# Patient Record
Sex: Female | Born: 1955 | Race: White | Hispanic: No | Smoking: Never smoker
Health system: Southern US, Community
[De-identification: ages and names within clinical notes are randomized; demographics above are authoritative.]

## PROBLEM LIST (undated history)

## (undated) DIAGNOSIS — J45909 Unspecified asthma, uncomplicated: Secondary | ICD-10-CM

## (undated) DIAGNOSIS — F32A Depression, unspecified: Secondary | ICD-10-CM

## (undated) DIAGNOSIS — M797 Fibromyalgia: Secondary | ICD-10-CM

## (undated) DIAGNOSIS — G4730 Sleep apnea, unspecified: Secondary | ICD-10-CM

## (undated) DIAGNOSIS — F419 Anxiety disorder, unspecified: Secondary | ICD-10-CM

## (undated) DIAGNOSIS — R87629 Unspecified abnormal cytological findings in specimens from vagina: Secondary | ICD-10-CM

## (undated) DIAGNOSIS — O139 Gestational [pregnancy-induced] hypertension without significant proteinuria, unspecified trimester: Secondary | ICD-10-CM

## (undated) DIAGNOSIS — M1990 Unspecified osteoarthritis, unspecified site: Secondary | ICD-10-CM

## (undated) DIAGNOSIS — O24419 Gestational diabetes mellitus in pregnancy, unspecified control: Secondary | ICD-10-CM

## (undated) DIAGNOSIS — I10 Essential (primary) hypertension: Secondary | ICD-10-CM

## (undated) HISTORY — DX: Unspecified abnormal cytological findings in specimens from vagina: R87.629

## (undated) HISTORY — DX: Depression, unspecified: F32.A

## (undated) HISTORY — DX: Sleep apnea, unspecified: G47.30

## (undated) HISTORY — DX: Unspecified osteoarthritis, unspecified site: M19.90

## (undated) HISTORY — PX: BREAST SURGERY: SHX581

## (undated) HISTORY — DX: Gestational (pregnancy-induced) hypertension without significant proteinuria, unspecified trimester: O13.9

## (undated) HISTORY — PX: TUBAL LIGATION: SHX77

## (undated) HISTORY — DX: Unspecified asthma, uncomplicated: J45.909

## (undated) HISTORY — DX: Anxiety disorder, unspecified: F41.9

## (undated) HISTORY — DX: Gestational diabetes mellitus in pregnancy, unspecified control: O24.419

---

## 2016-08-30 LAB — HM HEPATITIS C SCREENING LAB: HM Hepatitis Screen: NEGATIVE

## 2019-10-12 ENCOUNTER — Emergency Department (HOSPITAL_COMMUNITY): Payer: No Typology Code available for payment source

## 2019-10-12 ENCOUNTER — Emergency Department (HOSPITAL_COMMUNITY)
Admission: EM | Admit: 2019-10-12 | Discharge: 2019-10-12 | Disposition: A | Discharge status: Home / Self Care | Payer: No Typology Code available for payment source | Attending: Emergency Medicine | Admitting: Emergency Medicine

## 2019-10-12 ENCOUNTER — Other Ambulatory Visit: Payer: Self-pay

## 2019-10-12 ENCOUNTER — Encounter (HOSPITAL_COMMUNITY): Payer: Self-pay

## 2019-10-12 DIAGNOSIS — I10 Essential (primary) hypertension: Secondary | ICD-10-CM | POA: Diagnosis not present

## 2019-10-12 DIAGNOSIS — R0789 Other chest pain: Secondary | ICD-10-CM | POA: Insufficient documentation

## 2019-10-12 DIAGNOSIS — R0602 Shortness of breath: Secondary | ICD-10-CM | POA: Insufficient documentation

## 2019-10-12 DIAGNOSIS — R072 Precordial pain: Secondary | ICD-10-CM

## 2019-10-12 DIAGNOSIS — R079 Chest pain, unspecified: Secondary | ICD-10-CM

## 2019-10-12 HISTORY — DX: Fibromyalgia: M79.7

## 2019-10-12 HISTORY — DX: Essential (primary) hypertension: I10

## 2019-10-12 LAB — TROPONIN I (HIGH SENSITIVITY)
Troponin I (High Sensitivity): <2 ng/L (ref <18)
Troponin I (High Sensitivity): <2 ng/L (ref <18)

## 2019-10-12 LAB — CBC
HCT: 44.2 % (ref 36.0–46.0)
Hemoglobin: 14.3 g/dL (ref 12.0–15.0)
MCH: 29.4 pg (ref 26.0–34.0)
MCHC: 32.4 g/dL (ref 30.0–36.0)
MCV: 90.8 fL (ref 80.0–100.0)
Platelets: 438 10*3/uL — ABNORMAL HIGH (ref 150–400)
RBC: 4.87 MIL/uL (ref 3.87–5.11)
RDW: 13.1 % (ref 11.5–15.5)
WBC: 7.4 10*3/uL (ref 4.0–10.5)
nRBC: 0 % (ref 0.0–0.2)

## 2019-10-12 LAB — BASIC METABOLIC PANEL
Anion gap: 10 (ref 5–15)
BUN: 14 mg/dL (ref 8–23)
CO2: 25 mmol/L (ref 22–32)
Calcium: 9.9 mg/dL (ref 8.9–10.3)
Chloride: 104 mmol/L (ref 98–111)
Creatinine, Ser: 0.79 mg/dL (ref 0.44–1.00)
GFR calc Af Amer: >60 mL/min (ref >60)
GFR calc non Af Amer: >60 mL/min (ref >60)
Glucose, Bld: 93 mg/dL (ref 70–99)
Potassium: 4.3 mmol/L (ref 3.5–5.1)
Sodium: 139 mmol/L (ref 135–145)

## 2019-10-12 LAB — D-DIMER, QUANTITATIVE: D-Dimer, Quant: 0.73 ug/mL-FEU — ABNORMAL HIGH (ref 0.00–0.50)

## 2019-10-12 LAB — BRAIN NATRIURETIC PEPTIDE: B Natriuretic Peptide: 40.4 pg/mL (ref 0.0–100.0)

## 2019-10-12 MED ORDER — SODIUM CHLORIDE 0.9% FLUSH
3.0000 mL | Freq: Once | INTRAVENOUS | Status: AC
  Administered 2019-10-12: 3 mL via INTRAVENOUS

## 2019-10-12 MED ORDER — NITROGLYCERIN 0.4 MG SL SUBL: 0.4000 mg | SUBLINGUAL_TABLET | SUBLINGUAL | Status: DC | PRN

## 2019-10-12 MED ORDER — ASPIRIN 81 MG PO CHEW
324.0000 mg | CHEWABLE_TABLET | Freq: Once | ORAL | Status: AC
  Administered 2019-10-12: 324 mg via ORAL
  Filled 2019-10-12: qty 4

## 2019-10-12 MED ORDER — IOHEXOL 350 MG/ML SOLN
75.0000 mL | Freq: Once | INTRAVENOUS | Status: AC | PRN
  Administered 2019-10-12: 75 mL via INTRAVENOUS

## 2019-10-12 NOTE — Discharge Instructions (Addendum)
We saw you in the ER for the chest pain/shortness of breath. All of our cardiac workup is normal, including labs, EKG and chest X-RAY are normal.  We are not sure what is causing your discomfort, but we feel comfortable sending you home at this time.   We recommend that you follow-up with the cardiology clinic in 3 to 5 days. Please return to the ER if you have worsening chest pain, shortness of breath, pain radiating to your jaw, shoulder, or back, sweats or fainting. Otherwise see the Cardiologist or your primary care doctor as requested.

## 2019-10-12 NOTE — ED Provider Notes (Signed)
[facility] Provider Note   CSN: [id number] Arrival date & time: 10/12/19  1406     History Chief Complaint  Patient presents with  . Chest Pain    Christina Harrington is a 63 y.o. female.  HPI  HPI: A 63 year old patient with a history of hypertension presents for evaluation of chest pain. Initial onset of pain was more than 6 hours ago. The patient's chest pain is described as heaviness/pressure/tightness and is worse with exertion. The patient complains of nausea and reports some diaphoresis. The patient's chest pain is middle- or left-sided, is not well-localized, is not sharp and does not radiate to the arms/jaw/neck. The patient has no history of stroke, has no history of peripheral artery disease, has not smoked in the past 90 days, denies any history of treated diabetes, has no relevant family history of coronary artery disease (first degree relative at less than age 64), has no history of hypercholesterolemia and does not have an elevated BMI (>=30).   63 year old female with history of hypertension comes in a chief complaint of chest pain. She has had constant chest pain for the last day and a half.  There is waxing and waning intensity, and the chest pain is typically worse with exertion.  Pain is described as heaviness.  There is associated shortness of breath.  Patient has history of DVT and PE.  She is on Eliquis.  She reports that the quality of pain is similar to her PE pain as well, however with her PE that she had pleuritic component to the chest pain as well which is not present at this time.  Review of systems also negative for any cough.   Past Medical History:  Diagnosis Date  . Fibromyalgia syndrome   . Hypertension     There are no problems to display for this patient.   History reviewed. No pertinent surgical history.   OB History   No obstetric history on file.     No family history on file.  Social History    Tobacco Use  . Smoking status: Not on file  Substance Use Topics  . Alcohol use: Not on file  . Drug use: Not on file    Home Medications Prior to Admission medications   Not on File    Allergies    Patient has no known allergies.  Review of Systems   Review of Systems  Constitutional: Positive for activity change.  Respiratory: Positive for shortness of breath.   Cardiovascular: Positive for chest pain.  Gastrointestinal: Negative for nausea and vomiting.  Hematological: Does not bruise/bleed easily.  All other systems reviewed and are negative.   Physical Exam Updated Vital Signs BP 137/78   Pulse 79   Temp 98.4 F (36.9 C) (Oral)   Resp 17   SpO2 99%   Physical Exam Vitals and nursing note reviewed.  Constitutional:      Appearance: She is well-developed.  HENT:     Head: Normocephalic and atraumatic.  Cardiovascular:     Rate and Rhythm: Normal rate.  Pulmonary:     Effort: Pulmonary effort is normal.  Abdominal:     General: Bowel sounds are normal.  Musculoskeletal:     Cervical back: Normal range of motion and neck supple.     Right lower leg: No tenderness. No edema.     Left lower leg: No tenderness. No edema.  Skin:    General: Skin is warm and dry.  Neurological:  Mental Status: She is alert and oriented to person, place, and time.     ED Results / Procedures / Treatments   Labs (all labs ordered are listed, but only abnormal results are displayed) Labs Reviewed  CBC - Abnormal; Notable for the following components:      Result Value   Platelets 438 (*)    All other components within normal limits  D-DIMER, QUANTITATIVE (NOT AT [facility]) - Abnormal; Notable for the following components:   D-Dimer, Quant 0.73 (*)    All other components within normal limits  BASIC METABOLIC PANEL  BRAIN NATRIURETIC PEPTIDE  TROPONIN I (HIGH SENSITIVITY)  TROPONIN I (HIGH SENSITIVITY)    EKG EKG Interpretation  Date/Time:  Monday October 12 2019 14:17:02 EST Ventricular Rate:  106 PR Interval:  138 QRS Duration: 70 QT Interval:  338 QTC Calculation: 448 R Axis:   49 Text Interpretation: Sinus tachycardia Otherwise normal ECG No old tracing to compare No acute changes Confirmed by Derwood Kaplan [phone]) on 10/12/2019 5:28:57 PM   Radiology DG Chest 2 View  Result Date: 10/12/2019 CLINICAL DATA:  Chest pain, shortness of breath EXAM: CHEST - 2 VIEW COMPARISON:  None. FINDINGS: Heart and mediastinal contours are within normal limits. No focal opacities or effusions. No acute bony abnormality. IMPRESSION: No active cardiopulmonary disease. Electronically Signed   By: Charlett Nose M.D.   On: 10/12/2019 14:46   CT Angio Chest PE W and/or Wo Contrast  Result Date: 10/12/2019 CLINICAL DATA:  Chest pain EXAM: CT ANGIOGRAPHY CHEST WITH CONTRAST TECHNIQUE: Multidetector CT imaging of the chest was performed using the standard protocol during bolus administration of intravenous contrast. Multiplanar CT image reconstructions and MIPs were obtained to evaluate the vascular anatomy. CONTRAST:  41mL OMNIPAQUE IOHEXOL 350 MG/ML SOLN COMPARISON:  None. FINDINGS: Cardiovascular: No filling defects in the pulmonary arteries to suggest pulmonary emboli. Heart is normal size. Aorta is normal caliber. Mediastinum/Nodes: No mediastinal, hilar, or axillary adenopathy. Trachea and esophagus are unremarkable. Thyroid unremarkable. Lungs/Pleura: Small calcified granuloma in the left upper lobe. No confluent opacities or effusions. Upper Abdomen: Calcified granuloma is in the liver. No acute findings. Musculoskeletal: Chest wall soft tissues are unremarkable. No acute bony abnormality. Review of the MIP images confirms the above findings. IMPRESSION: No evidence of pulmonary embolus. No acute cardiopulmonary disease. Electronically Signed   By: Charlett Nose M.D.   On: 10/12/2019 19:03    Procedures Procedures (including critical care time)  Medications  Ordered in ED Medications  nitroGLYCERIN (NITROSTAT) SL tablet 0.4 mg (has no administration in time range)  sodium chloride flush (NS) 0.9 % injection 3 mL (3 mLs Intravenous Given 10/12/19 1746)  aspirin chewable tablet 324 mg (324 mg Oral Given 10/12/19 1740)  iohexol (OMNIPAQUE) 350 MG/ML injection 75 mL (75 mLs Intravenous Contrast Given 10/12/19 1853)    ED Course  I have reviewed the triage vital signs and the nursing notes.  Pertinent labs & imaging results that were available during my care of the patient were reviewed by me and considered in my medical decision making (see chart for details).    MDM Rules/Calculators/A&P HEAR Score: 4                     Final Clinical Impression(s) / ED Diagnoses Final diagnoses:  Nonspecific chest pain  Precordial chest pain    63 year old comes in a chief complaint of chest pain. Patient has history of hypertension.  Hear score is 4.  Pain  is constant and has some typical features as it is described as heaviness and it is worse with exertion.  She has history of PE, DVT and is on Eliquis.  D-dimer ordered and it is positive.  CT PE is negative for any acute findings.  Delta troponin in the ED are negative. For now we feel comfortable sending patient home with cardiology follow-up.  Strict ER return precautions have been discussed with the patient and she is comfortable returning to the ER if her symptoms get worse.  Rx / DC Orders ED Discharge Orders    None       Varney Biles, MD 10/12/19 [phone]

## 2019-10-12 NOTE — ED Triage Notes (Signed)
Patient complains of SSCP that started last night and describes as a pressure. No associated symptoms. Reports sent by [facility]. Denies cough, no fever

## 2019-10-12 NOTE — ED Notes (Signed)
Pt reports chest "pressure."

## 2020-02-25 ENCOUNTER — Other Ambulatory Visit: Payer: Self-pay

## 2020-02-25 ENCOUNTER — Encounter: Payer: Self-pay | Admitting: Podiatry

## 2020-02-25 ENCOUNTER — Ambulatory Visit (INDEPENDENT_AMBULATORY_CARE_PROVIDER_SITE_OTHER): Payer: No Typology Code available for payment source | Admitting: Podiatry

## 2020-02-25 ENCOUNTER — Ambulatory Visit (INDEPENDENT_AMBULATORY_CARE_PROVIDER_SITE_OTHER): Payer: No Typology Code available for payment source

## 2020-02-25 ENCOUNTER — Other Ambulatory Visit: Payer: Self-pay | Admitting: Podiatry

## 2020-02-25 VITALS — BP 108/80 | HR 95 | Temp 96.5°F | Resp 16

## 2020-02-25 DIAGNOSIS — M205X1 Other deformities of toe(s) (acquired), right foot: Secondary | ICD-10-CM

## 2020-02-25 DIAGNOSIS — M2012 Hallux valgus (acquired), left foot: Secondary | ICD-10-CM

## 2020-02-25 DIAGNOSIS — M25571 Pain in right ankle and joints of right foot: Secondary | ICD-10-CM

## 2020-02-25 DIAGNOSIS — M779 Enthesopathy, unspecified: Secondary | ICD-10-CM

## 2020-02-25 DIAGNOSIS — M2011 Hallux valgus (acquired), right foot: Secondary | ICD-10-CM

## 2020-02-25 DIAGNOSIS — M778 Other enthesopathies, not elsewhere classified: Secondary | ICD-10-CM

## 2020-02-25 NOTE — Progress Notes (Signed)
   Subjective:    Patient ID: Christina Harrington, female    DOB: 05-Sep-1956, 64 y.o.   MRN: [id number]  HPI    Review of Systems  All other systems reviewed and are negative.      Objective:   Physical Exam        Assessment & Plan:

## 2020-02-25 NOTE — Progress Notes (Signed)
Subjective:   Patient ID: Christina Harrington, female   DOB: 64 y.o.   MRN: [id number]   HPI Patient presents stating she has had history of bunion surgery row at a [place] in [place] and has a lot of pain in the joint and knows eventually she will need surgery again but she is trying to avoid it and orthotics have been helpful in the past and she needs a new pair.  States is been very inflamed and sore and its been going on for a fairly long period of time.  Patient does not smoke would like to be more active   Review of Systems  All other systems reviewed and are negative.       Objective:  Physical Exam Vitals and nursing note reviewed.  Constitutional:      Appearance: She is well-developed.  Pulmonary:     Effort: Pulmonary effort is normal.  Musculoskeletal:        General: Normal range of motion.  Skin:    General: Skin is warm.  Neurological:     Mental Status: She is alert.     Neurovascular status intact muscle strength adequate range of motion within normal limits.  Patient is found to have exquisite discomfort in the first MPJ right with complete lack of motion and history of surgery in the joint surface.  Patient has moderate discomfort in the left not to the same degree and has had hammertoe repair second right.  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  Patient understands ultimately will require implant or fusion and I educated on the differences between these 2 and most likely she would like implant  X-rays indicate that the patient has complete closure of the first MPJ right with flattening of the joint surface with moderate deformity on the left

## 2020-02-25 NOTE — Patient Instructions (Signed)
Bunion  A bunion is a bump on the base of the big toe that forms when the bones of the big toe joint move out of position. Bunions may be small at first, but they often get larger over time. They can make walking painful. What are the causes? A bunion may be caused by:  Wearing narrow or pointed shoes that force the big toe to press against the other toes.  Abnormal foot development that causes the foot to roll inward (pronate).  Changes in the foot that are caused by certain diseases, such as rheumatoid arthritis or polio.  A foot injury. What increases the risk? The following factors may make you more likely to develop this condition:  Wearing shoes that squeeze the toes together.  Having certain diseases, such as: ? Rheumatoid arthritis. ? Polio. ? Cerebral palsy.  Having family members who have bunions.  Being born with a foot deformity, such as flat feet or low arches.  Doing activities that put a lot of pressure on the feet, such as ballet dancing. What are the signs or symptoms? The main symptom of a bunion is a noticeable bump on the big toe. Other symptoms may include:  Pain.  Swelling around the big toe.  Redness and inflammation.  Thick or hardened skin on the big toe or between the toes.  Stiffness or loss of motion in the big toe.  Trouble with walking. How is this diagnosed? A bunion may be diagnosed based on your symptoms, medical history, and activities. You may have tests, such as:  X-rays. These allow your health care provider to check the position of the bones in your foot and look for damage to your joint. They also help your health care provider determine the severity of your bunion and the best way to treat it.  Joint aspiration. In this test, a sample of fluid is removed from the toe joint. This test may be done if you are in a lot of pain. It helps rule out diseases that cause painful swelling of the joints, such as arthritis. How is this  treated? Treatment depends on the severity of your symptoms. The goal of treatment is to relieve symptoms and prevent the bunion from getting worse. Your health care provider may recommend:  Wearing shoes that have a wide toe box.  Using bunion pads to cushion the affected area.  Taping your toes together to keep them in a normal position.  Placing a device inside your shoe (orthotics) to help reduce pressure on your toe joint.  Taking medicine to ease pain, inflammation, and swelling.  Applying heat or ice to the affected area.  Doing stretching exercises.  Surgery to remove scar tissue and move the toes back into their normal position. This treatment is rare. Follow these instructions at home: Managing pain, stiffness, and swelling   If directed, put ice on the painful area: ? Put ice in a plastic bag. ? Place a towel between your skin and the bag. ? Leave the ice on for 20 minutes, 2-3 times a day. Activity   If directed, apply heat to the affected area before you exercise. Use the heat source that your health care provider recommends, such as a moist heat pack or a heating pad. ? Place a towel between your skin and the heat source. ? Leave the heat on for 20-30 minutes. ? Remove the heat if your skin turns bright red. This is especially important if you are unable to feel pain,   heat, or cold. You may have a greater risk of getting burned.  Do exercises as told by your health care provider. General instructions  Support your toe joint with proper footwear, shoe padding, or taping as told by your health care provider.  Take over-the-counter and prescription medicines only as told by your health care provider.  Keep all follow-up visits as told by your health care provider. This is important. Contact a health care provider if your symptoms:  Get worse.  Do not improve in 2 weeks. Get help right away if you have:  Severe pain and trouble with walking. Summary  A  bunion is a bump on the base of the big toe that forms when the bones of the big toe joint move out of position.  Bunions can make walking painful.  Treatment depends on the severity of your symptoms.  Support your toe joint with proper footwear, shoe padding, or taping as told by your health care provider. This information is not intended to replace advice given to you by your health care provider. Make sure you discuss any questions you have with your health care provider. Document Revised: 04/21/2018 Document Reviewed: 02/25/2018 Elsevier Patient Education  2020 Elsevier Inc.  

## 2020-03-04 ENCOUNTER — Telehealth: Payer: Self-pay | Admitting: Podiatry

## 2020-03-04 NOTE — Telephone Encounter (Signed)
Left message for [facility] to see how we can go about getting pt orthotics thru our office.

## 2020-03-10 ENCOUNTER — Telehealth: Payer: Self-pay | Admitting: Podiatry

## 2020-03-10 NOTE — Telephone Encounter (Signed)
Received a call back from [place] @ [facility] [place] and she told me to contact Jody the nurse navigator @ [facility] (her # is [phone]) and leave a message with pts last name and last 4 of ssn.   I have called Jody and left this information.

## 2020-03-15 NOTE — Addendum Note (Signed)
Addended by: Fritz Pickerel A on: 03/15/2020 10:27 AM   Modules accepted: Orders

## 2020-03-31 ENCOUNTER — Other Ambulatory Visit: Payer: No Typology Code available for payment source | Admitting: Orthotics

## 2020-04-04 ENCOUNTER — Other Ambulatory Visit: Payer: Self-pay

## 2020-04-04 ENCOUNTER — Ambulatory Visit: Payer: No Typology Code available for payment source | Admitting: Orthotics

## 2020-04-04 DIAGNOSIS — M2011 Hallux valgus (acquired), right foot: Secondary | ICD-10-CM

## 2020-04-04 DIAGNOSIS — M779 Enthesopathy, unspecified: Secondary | ICD-10-CM

## 2020-04-04 DIAGNOSIS — M2012 Hallux valgus (acquired), left foot: Secondary | ICD-10-CM

## 2020-04-04 NOTE — Progress Notes (Signed)
Patient is here today to be evaluated and cast for CMFO.  Patient has hx of functional hallux limitus (FHL), and needs a supportive orthoses that will plantarflex first ray in order to lower hinge pin of first MPJ and enhance windless effect.  Plan of deep heel cup, hug arch, and MORTONS extension (she has had good results in past)

## 2020-04-14 ENCOUNTER — Other Ambulatory Visit: Payer: Self-pay

## 2020-04-14 ENCOUNTER — Ambulatory Visit (INDEPENDENT_AMBULATORY_CARE_PROVIDER_SITE_OTHER): Payer: No Typology Code available for payment source | Admitting: Podiatry

## 2020-04-14 ENCOUNTER — Encounter: Payer: Self-pay | Admitting: Podiatry

## 2020-04-14 DIAGNOSIS — M205X1 Other deformities of toe(s) (acquired), right foot: Secondary | ICD-10-CM | POA: Diagnosis not present

## 2020-04-14 NOTE — Patient Instructions (Signed)
Pre-Operative Instructions  Congratulations, you have decided to take an important step towards improving your quality of life.  You can be assured that the doctors and staff at [facility] will be with you every step of the way.  Here are some important things you should know:  1. Plan to be at the surgery center/hospital at least 1 (one) hour prior to your scheduled time, unless otherwise directed by the surgical center/hospital staff.  You must have a responsible adult accompany you, remain during the surgery and drive you home.  Make sure you have directions to the surgical center/hospital to ensure you arrive on time. 2. If you are having surgery at [facility] or [facility], you will need a copy of your medical history and physical form from your family physician within one month prior to the date of surgery. We will give you a form for your primary physician to complete.  3. We make every effort to accommodate the date you request for surgery.  However, there are times where surgery dates or times have to be moved.  We will contact you as soon as possible if a change in schedule is required.   4. No aspirin/ibuprofen for one week before surgery.  If you are on aspirin, any non-steroidal anti-inflammatory medications (Mobic, Aleve, Ibuprofen) should not be taken seven (7) days prior to your surgery.  You make take Tylenol for pain prior to surgery.  5. Medications - If you are taking daily heart and blood pressure medications, seizure, reflux, allergy, asthma, anxiety, pain or diabetes medications, make sure you notify the surgery center/hospital before the day of surgery so they can tell you which medications you should take or avoid the day of surgery. 6. No food or drink after midnight the night before surgery unless directed otherwise by surgical center/hospital staff. 7. No alcoholic beverages 24-hours prior to surgery.  No smoking 24-hours prior or 24-hours after  surgery. 8. Wear loose pants or shorts. They should be loose enough to fit over bandages, boots, and casts. 9. Don't wear slip-on shoes. Sneakers are preferred. 10. Bring your boot with you to the surgery center/hospital.  Also bring crutches or a walker if your physician has prescribed it for you.  If you do not have this equipment, it will be provided for you after surgery. 11. If you have not been contacted by the surgery center/hospital by the day before your surgery, call to confirm the date and time of your surgery. 12. Leave-time from work may vary depending on the type of surgery you have.  Appropriate arrangements should be made prior to surgery with your employer. 13. Prescriptions will be provided immediately following surgery by your doctor.  Fill these as soon as possible after surgery and take the medication as directed. Pain medications will not be refilled on weekends and must be approved by the doctor. 14. Remove nail polish on the operative foot and avoid getting pedicures prior to surgery. 15. Wash the night before surgery.  The night before surgery wash the foot and leg well with water and the antibacterial soap provided. Be sure to pay special attention to beneath the toenails and in between the toes.  Wash for at least three (3) minutes. Rinse thoroughly with water and dry well with a towel.  Perform this wash unless told not to do so by your physician.  Enclosed: 1 Ice pack (please put in freezer the night before surgery)   1 Hibiclens skin cleaner     Pre-op instructions  If you have any questions regarding the instructions, please do not hesitate to call our office.  [place]: [address], [place], [place] [address] -- [phone]  [place]: [address] [place], [place] [address] -- [phone]  [place]: [address], [place], [place] [address] -- [phone]   Website: [email or web address] 

## 2020-04-15 NOTE — Progress Notes (Signed)
Subjective:   Patient ID: Christina Harrington, female   DOB: 64 y.o.   MRN: [id number]   HPI Patient presents stating that the right big toe joint is really hurting again and she only had temporary relief and she wants to go ahead and have surgical intervention.  States that she was hopeful but it is not helping her   ROS      Objective:  Physical Exam  Neurovascular status was found to be intact with exquisite discomfort first MPJ of the right foot that is very painful when pressed and is making walking shoe gear difficult.  Patient has had this a long time and had previous surgical intervention for spur removal which was not helpful     Assessment:  Severe hallux limitus deformity right     Plan:  Reviewed condition and recommended implant arthroplasty in this condition in this situation.  Patient wants this done and I did review implant arthroplasty allowing her to go over consent form discussing alternative treatments complications.  Patient understands no guarantee as far success of surgery and absolutely understands that the implant may wear out and ultimately may have to be replaced perfusion may be necessary.  Patient wants surgery understanding risk understands recovery can take 6 months total and signed consent form.  I did dispense air fracture walker with all instructions on usage and I want her to get used to it prior to surgery and I encouraged her to call with any questions concerns which may come up prior to her procedure

## 2020-04-25 ENCOUNTER — Ambulatory Visit: Payer: No Typology Code available for payment source | Admitting: Orthotics

## 2020-04-25 ENCOUNTER — Other Ambulatory Visit: Payer: Self-pay

## 2020-04-25 DIAGNOSIS — M205X1 Other deformities of toe(s) (acquired), right foot: Secondary | ICD-10-CM

## 2020-04-25 NOTE — Progress Notes (Signed)
Patient came in today to pick up custom made foot orthotics.  The goals were accomplished and the patient reported no dissatisfaction with said orthotics.  Patient was advised of breakin period and how to report any issues. 

## 2020-05-09 ENCOUNTER — Telehealth: Payer: Self-pay

## 2020-05-09 NOTE — Telephone Encounter (Signed)
DOS 05/31/20  KELLER BUNION IMPLANT RT - [id number]  [facility] AUTH # [id number] [facility] SURGERY FROM 02/25/2020 - 08/23/2020

## 2020-05-31 ENCOUNTER — Encounter: Payer: Self-pay | Admitting: Podiatry

## 2020-05-31 DIAGNOSIS — M2021 Hallux rigidus, right foot: Secondary | ICD-10-CM

## 2020-05-31 MED ORDER — ONDANSETRON HCL 4 MG PO TABS: 4.0000 mg | ORAL_TABLET | Freq: Three times a day (TID) | ORAL | 0 refills | Status: DC | PRN

## 2020-05-31 MED ORDER — OXYCODONE-ACETAMINOPHEN 10-325 MG PO TABS: 1.0000 | ORAL_TABLET | ORAL | 0 refills | Status: DC | PRN

## 2020-05-31 NOTE — Addendum Note (Signed)
Addended by: Lenn Sink on: 05/31/2020 01:00 PM   Modules accepted: Orders

## 2020-06-08 ENCOUNTER — Other Ambulatory Visit: Payer: Self-pay

## 2020-06-08 ENCOUNTER — Ambulatory Visit (INDEPENDENT_AMBULATORY_CARE_PROVIDER_SITE_OTHER): Payer: No Typology Code available for payment source

## 2020-06-08 ENCOUNTER — Ambulatory Visit (INDEPENDENT_AMBULATORY_CARE_PROVIDER_SITE_OTHER): Payer: No Typology Code available for payment source | Admitting: Podiatry

## 2020-06-08 ENCOUNTER — Encounter: Payer: Self-pay | Admitting: Podiatry

## 2020-06-08 VITALS — BP 102/69 | HR 62 | Temp 98.3°F | Resp 16

## 2020-06-08 DIAGNOSIS — M205X1 Other deformities of toe(s) (acquired), right foot: Secondary | ICD-10-CM

## 2020-06-14 NOTE — Progress Notes (Signed)
Subjective:   Patient ID: Christina Harrington, female   DOB: 64 y.o.   MRN: [id number]   HPI Patient states she is very pleased with how she is doing so far with her right foot with minimal discomfort and able to walk with her foot   ROS      Objective:  Physical Exam  Neurovascular status intact negative Denna Haggard' sign noted wound edges well coapted hallux in rectus position excellent range of motion     Assessment:  Doing well post implant procedure right     Plan:  H&P x-ray reviewed and reapplied sterile dressing advised on continued elevation compression immobilization and reappoint in the next several weeks or earlier if any issues were to occur.  Explained range of motion exercises to her today  X-rays indicated that the position of the implant looks good with good alignment noted

## 2020-07-06 ENCOUNTER — Ambulatory Visit (INDEPENDENT_AMBULATORY_CARE_PROVIDER_SITE_OTHER): Payer: No Typology Code available for payment source | Admitting: Podiatry

## 2020-07-06 ENCOUNTER — Encounter: Payer: Self-pay | Admitting: Podiatry

## 2020-07-06 ENCOUNTER — Ambulatory Visit (INDEPENDENT_AMBULATORY_CARE_PROVIDER_SITE_OTHER): Payer: No Typology Code available for payment source

## 2020-07-06 ENCOUNTER — Other Ambulatory Visit: Payer: Self-pay

## 2020-07-06 DIAGNOSIS — M205X1 Other deformities of toe(s) (acquired), right foot: Secondary | ICD-10-CM

## 2020-07-06 NOTE — Progress Notes (Signed)
Subjective:   Patient ID: Christina Harrington, female   DOB: 64 y.o.   MRN: [id number]   HPI Patient presents stating the implant is doing fantastic and I am so happy with the results of this procedure   ROS      Objective:  Physical Exam  Neurovascular status intact negative Denna Haggard' sign noted wound edges well coapted excellent range of motion first MPJ no crepitus of the joint     Assessment:  Doing well post implant procedure first MPJ right with wound edges healing well and good range of motion     Plan:  H&P x-ray reviewed and sterile dressing reapplied.  Dispensed ankle compression stocking advised on gradual return to soft shoe gear and reappoint to recheck as needed and encouraged her to continue activity  X-rays indicate that the implant is well seated slightly gapping lateral side but should not be a problem at all

## 2021-02-13 ENCOUNTER — Ambulatory Visit (INDEPENDENT_AMBULATORY_CARE_PROVIDER_SITE_OTHER): Payer: Medicare Other

## 2021-02-13 ENCOUNTER — Other Ambulatory Visit: Payer: Self-pay

## 2021-02-13 ENCOUNTER — Ambulatory Visit (INDEPENDENT_AMBULATORY_CARE_PROVIDER_SITE_OTHER): Payer: Medicare Other | Admitting: Podiatry

## 2021-02-13 DIAGNOSIS — M79671 Pain in right foot: Secondary | ICD-10-CM | POA: Diagnosis not present

## 2021-02-13 DIAGNOSIS — M722 Plantar fascial fibromatosis: Secondary | ICD-10-CM

## 2021-02-13 DIAGNOSIS — M778 Other enthesopathies, not elsewhere classified: Secondary | ICD-10-CM | POA: Diagnosis not present

## 2021-02-13 MED ORDER — TRIAMCINOLONE ACETONIDE 10 MG/ML IJ SUSP
20.0000 mg | Freq: Once | INTRAMUSCULAR | Status: AC
Start: 2021-02-13 — End: 2021-02-13
  Administered 2021-02-13: 20 mg

## 2021-02-15 NOTE — Progress Notes (Signed)
Subjective:   Patient ID: Christina Harrington, female   DOB: 65 y.o.   MRN: [id number]   HPI Patient states she developed a lot of forefoot pain right and it makes it hard for her to walk comfortably.  Also has a lot of pain in her heel and states that its been bothering her and making it hard to walk   ROS      Objective:  Physical Exam  Neurovascular status intact with exquisite discomfort in the right plantar fascia at the insertional point of the tendon calcaneus and also forefoot pain around the second MPJ with inflammation fluid of the joint and moderate swelling of the joint surface.  Patient has good digital perfusion well oriented x3 with significant depression of the arch noted     Assessment:  Acute plantar fasciitis right with probable compensatory capsulitis of the second MPJ right with flatfoot deformity     Plan:  H&P reviewed condition and focus on both areas.  I did sterile prep and injected the plantar fascial right 3 mg Kenalog 5 mg Xylocaine and then for the forefoot I went ahead did a forefoot block aspirated the joint getting a small amount of clear fluid injected quarter cc dexamethasone Kenalog and applied thick plantar pad to reduce pressure on the joint and then applied ankle brace to lift up the arch and reduce the stress against the fascial band  X-rays indicate moderate depression of the arch no indications of arthritis small spur formation noted no stress fracture noted

## 2021-02-16 ENCOUNTER — Other Ambulatory Visit: Payer: Self-pay | Admitting: Podiatry

## 2021-02-16 DIAGNOSIS — M722 Plantar fascial fibromatosis: Secondary | ICD-10-CM

## 2021-03-06 ENCOUNTER — Ambulatory Visit (INDEPENDENT_AMBULATORY_CARE_PROVIDER_SITE_OTHER): Payer: Medicare Other | Admitting: Podiatry

## 2021-03-06 ENCOUNTER — Other Ambulatory Visit: Payer: Self-pay

## 2021-03-06 DIAGNOSIS — M205X1 Other deformities of toe(s) (acquired), right foot: Secondary | ICD-10-CM

## 2021-03-06 DIAGNOSIS — M779 Enthesopathy, unspecified: Secondary | ICD-10-CM

## 2021-03-06 DIAGNOSIS — M722 Plantar fascial fibromatosis: Secondary | ICD-10-CM

## 2021-03-08 NOTE — Progress Notes (Signed)
Subjective:   Patient ID: Christina Harrington, female   DOB: 65 y.o.   MRN: [id number]   HPI Patient states she continues to experience discomfort in the midfoot right and into the arch it is localized and mildly tender.  She has improved but she is not able to do the types of activities she wants   ROS      Objective:  Physical Exam  Neurovascular status intact with discomfort still present moderate depression of the arch old orthotics which no longer hold her arch up properly     Assessment:  Fasciitis tendinitis-like symptomatology with moderate depression of the arch right     Plan:  H&P reviewed condition recommended orthotics and went ahead and scanned for customized orthotic devices and advised on support therapy and shoe gear modification.  Patient will be seen back when orthotics return did discuss topical medicines ice for the arch at this time

## 2021-03-28 ENCOUNTER — Other Ambulatory Visit: Payer: Medicare Other

## 2021-03-29 ENCOUNTER — Other Ambulatory Visit: Payer: Self-pay

## 2021-03-29 ENCOUNTER — Telehealth: Payer: Self-pay | Admitting: *Deleted

## 2021-03-29 ENCOUNTER — Other Ambulatory Visit: Payer: Medicare Other

## 2021-03-29 NOTE — Telephone Encounter (Signed)
Patient picked up and tried on Orthotics today, dispensed by A Jiles Goya-03/29/21.

## 2021-04-12 DIAGNOSIS — M76829 Posterior tibial tendinitis, unspecified leg: Secondary | ICD-10-CM | POA: Insufficient documentation

## 2021-04-26 DIAGNOSIS — M7741 Metatarsalgia, right foot: Secondary | ICD-10-CM

## 2021-04-26 HISTORY — DX: Metatarsalgia, right foot: M77.41

## 2021-08-02 DIAGNOSIS — D126 Benign neoplasm of colon, unspecified: Secondary | ICD-10-CM | POA: Insufficient documentation

## 2021-09-26 ENCOUNTER — Encounter: Payer: Self-pay | Admitting: Family Medicine

## 2021-09-26 ENCOUNTER — Ambulatory Visit (INDEPENDENT_AMBULATORY_CARE_PROVIDER_SITE_OTHER): Payer: Medicare Other | Admitting: Family Medicine

## 2021-09-26 ENCOUNTER — Other Ambulatory Visit: Payer: Self-pay

## 2021-09-26 ENCOUNTER — Other Ambulatory Visit (HOSPITAL_COMMUNITY)
Admission: RE | Admit: 2021-09-26 | Discharge: 2021-09-26 | Disposition: A | Discharge status: Home / Self Care | Payer: Medicare Other | Source: Ambulatory Visit | Attending: Family Medicine | Admitting: Family Medicine

## 2021-09-26 VITALS — BP 134/78 | HR 82 | Ht 68.0 in | Wt 193.0 lb

## 2021-09-26 DIAGNOSIS — Z1151 Encounter for screening for human papillomavirus (HPV): Secondary | ICD-10-CM | POA: Insufficient documentation

## 2021-09-26 DIAGNOSIS — Z86711 Personal history of pulmonary embolism: Secondary | ICD-10-CM | POA: Insufficient documentation

## 2021-09-26 DIAGNOSIS — Z1231 Encounter for screening mammogram for malignant neoplasm of breast: Secondary | ICD-10-CM

## 2021-09-26 DIAGNOSIS — Z01419 Encounter for gynecological examination (general) (routine) without abnormal findings: Secondary | ICD-10-CM

## 2021-09-26 DIAGNOSIS — Z124 Encounter for screening for malignant neoplasm of cervix: Secondary | ICD-10-CM | POA: Diagnosis not present

## 2021-09-26 DIAGNOSIS — Z0000 Encounter for general adult medical examination without abnormal findings: Secondary | ICD-10-CM

## 2021-09-26 NOTE — Progress Notes (Signed)
New GYN presents for AEX establish care and get Mammogram referral.

## 2021-09-26 NOTE — Progress Notes (Signed)
   GYNECOLOGY OFFICE VISIT NOTE  History:  65 y.o. G2P0 here today establish care and annual physical exam. She denies any abnormal vaginal discharge, bleeding, pelvic pain or other concerns.   The following portions of the patient's history were reviewed and updated as appropriate: allergies, current medications, past family history, past medical history, past social history, past surgical history and problem list.   Family hx of breast cancer: genetic testing normal. Paternal aunt with breast cancer.  Health Maintenance:  Not sure when last PAP was.  Thinks >5 years ago. Last mammogram 1 year ago.   Review of Systems:  Pertinent items noted in HPI Review of Systems  All other systems reviewed and are negative.  Objective:  Physical Exam BP 134/78   Pulse 82   Ht 5\' 8"  (1.727 m)   Wt 193 lb (87.5 kg)   LMP  (LMP Unknown)   BMI 29.35 kg/m  Physical Exam Vitals and nursing note reviewed. Exam conducted with a chaperone present.  HENT:     Head: Normocephalic.  Cardiovascular:     Rate and Rhythm: Normal rate and regular rhythm.     Pulses: Normal pulses.  Pulmonary:     Effort: Pulmonary effort is normal.  Chest:     Comments: Breast exam without areas of nodules or lumps Abdominal:     General: Abdomen is flat.  Genitourinary:    General: Normal vulva.     Comments: Os visualized on speculum exam Skin:    General: Skin is warm.     Capillary Refill: Capillary refill takes less than 2 seconds.  Neurological:     General: No focal deficit present.     Mental Status: She is alert and oriented to person, place, and time.    Labs and Imaging No results found for this or any previous visit (from the past 168 hour(s)). No results found.  Assessment & Plan:  1. Annual physical exam Here to establish care. Moved to [place] 3 years ago. History gathered and updated. - unclear when last PAP was, >5 years ago - PAP today - Has PCP at in [place] who does other routine  screening including colonoscopy  2. Encounter for screening mammogram for malignant neoplasm of breast Family hx of breast cancer. Negative genetic testing. Patient has had mammogram, [place], and MRI in past. Previous testing without evidence of malignancy - Mammogram ordered   Routine preventative health maintenance measures emphasized. Please refer to After Visit Summary for other counseling recommendations.   No follow-ups on file.   Total face-to-face time with patient: 30 minutes.  Over 50% of encounter was spent on counseling and coordination of care.  [place], MD, MPH OB Fellow, Faculty [facility] for [facility], [facility]

## 2021-09-28 LAB — CYTOLOGY - PAP
Comment: NEGATIVE
Diagnosis: NEGATIVE
High risk HPV: NEGATIVE

## 2021-10-09 ENCOUNTER — Other Ambulatory Visit: Payer: Self-pay

## 2021-10-09 ENCOUNTER — Encounter (HOSPITAL_BASED_OUTPATIENT_CLINIC_OR_DEPARTMENT_OTHER): Payer: Self-pay | Admitting: Radiology

## 2021-10-09 ENCOUNTER — Ambulatory Visit (HOSPITAL_BASED_OUTPATIENT_CLINIC_OR_DEPARTMENT_OTHER)
Admission: RE | Admit: 2021-10-09 | Discharge: 2021-10-09 | Disposition: A | Discharge status: Home / Self Care | Payer: Medicare Other | Source: Ambulatory Visit | Attending: Family Medicine | Admitting: Family Medicine

## 2021-10-09 DIAGNOSIS — Z0000 Encounter for general adult medical examination without abnormal findings: Secondary | ICD-10-CM | POA: Diagnosis not present

## 2021-10-09 DIAGNOSIS — Z1231 Encounter for screening mammogram for malignant neoplasm of breast: Secondary | ICD-10-CM | POA: Diagnosis not present

## 2021-11-27 DIAGNOSIS — K219 Gastro-esophageal reflux disease without esophagitis: Secondary | ICD-10-CM | POA: Insufficient documentation

## 2021-11-27 DIAGNOSIS — K589 Irritable bowel syndrome without diarrhea: Secondary | ICD-10-CM | POA: Insufficient documentation

## 2021-11-27 DIAGNOSIS — M797 Fibromyalgia: Secondary | ICD-10-CM | POA: Insufficient documentation

## 2021-11-27 DIAGNOSIS — G4733 Obstructive sleep apnea (adult) (pediatric): Secondary | ICD-10-CM | POA: Insufficient documentation

## 2021-11-27 DIAGNOSIS — I10 Essential (primary) hypertension: Secondary | ICD-10-CM | POA: Insufficient documentation

## 2022-03-12 ENCOUNTER — Emergency Department (HOSPITAL_BASED_OUTPATIENT_CLINIC_OR_DEPARTMENT_OTHER)
Admission: EM | Admit: 2022-03-12 | Discharge: 2022-03-12 | Disposition: A | Discharge status: Home / Self Care | Payer: Medicare Other | Attending: Emergency Medicine | Admitting: Emergency Medicine

## 2022-03-12 ENCOUNTER — Encounter (HOSPITAL_BASED_OUTPATIENT_CLINIC_OR_DEPARTMENT_OTHER): Payer: Self-pay | Admitting: Obstetrics and Gynecology

## 2022-03-12 ENCOUNTER — Other Ambulatory Visit: Payer: Self-pay

## 2022-03-12 ENCOUNTER — Other Ambulatory Visit (HOSPITAL_BASED_OUTPATIENT_CLINIC_OR_DEPARTMENT_OTHER): Payer: Self-pay

## 2022-03-12 ENCOUNTER — Emergency Department (HOSPITAL_BASED_OUTPATIENT_CLINIC_OR_DEPARTMENT_OTHER): Payer: Medicare Other

## 2022-03-12 DIAGNOSIS — Z7901 Long term (current) use of anticoagulants: Secondary | ICD-10-CM | POA: Insufficient documentation

## 2022-03-12 DIAGNOSIS — Z79899 Other long term (current) drug therapy: Secondary | ICD-10-CM | POA: Diagnosis not present

## 2022-03-12 DIAGNOSIS — Z7951 Long term (current) use of inhaled steroids: Secondary | ICD-10-CM | POA: Insufficient documentation

## 2022-03-12 DIAGNOSIS — R053 Chronic cough: Secondary | ICD-10-CM | POA: Diagnosis present

## 2022-03-12 DIAGNOSIS — I10 Essential (primary) hypertension: Secondary | ICD-10-CM | POA: Insufficient documentation

## 2022-03-12 DIAGNOSIS — U071 COVID-19: Secondary | ICD-10-CM | POA: Diagnosis not present

## 2022-03-12 DIAGNOSIS — J45909 Unspecified asthma, uncomplicated: Secondary | ICD-10-CM | POA: Diagnosis not present

## 2022-03-12 MED ORDER — BENZONATATE 100 MG PO CAPS
100.0000 mg | ORAL_CAPSULE | Freq: Three times a day (TID) | ORAL | 0 refills | Status: DC
  Filled 2022-03-12: qty 21, 7d supply, fill #0

## 2022-03-12 MED ORDER — ALBUTEROL SULFATE HFA 108 (90 BASE) MCG/ACT IN AERS
2.0000 | INHALATION_SPRAY | Freq: Once | RESPIRATORY_TRACT | Status: AC
Start: 2022-03-12 — End: 2022-03-12
  Administered 2022-03-12: 2 via RESPIRATORY_TRACT
  Filled 2022-03-12: qty 6.7

## 2022-03-12 MED ORDER — DEXAMETHASONE 4 MG PO TABS
10.0000 mg | ORAL_TABLET | Freq: Once | ORAL | Status: AC
  Administered 2022-03-12: 10 mg via ORAL
  Filled 2022-03-12: qty 3

## 2022-03-12 MED ORDER — BENZONATATE 100 MG PO CAPS
200.0000 mg | ORAL_CAPSULE | Freq: Once | ORAL | Status: AC
  Administered 2022-03-12: 200 mg via ORAL
  Filled 2022-03-12: qty 2

## 2022-03-12 NOTE — ED Notes (Signed)
Discharge paperwork given and understood. 

## 2022-03-12 NOTE — ED Provider Notes (Signed)
?[facility] EMERGENCY DEPT ?Provider Note ? ? ?CSN: [id number] ?Arrival date & time: 03/12/22  1138 ? ?  ? ?History ? ?Chief Complaint  ?Patient presents with  ? Covid Positive  ? ? ?Christina Harrington is a 66 y.o. female. ? ?Patient here with cough.  Diagnosed with COVID over the weekend.  Has had symptoms for over a week.  History of hypertension, asthma.  Denies any fevers or chills.  She has had a fairly persistent cough.  No major shortness of breath.  Denies any chest pain, abdominal pain.  Nothing makes it worse or better.  Has been using albuterol. ? ? ? ?  ? ?Home Medications ?Prior to Admission medications   ?Medication Sig Start Date End Date Taking? Authorizing Provider  ?benzonatate (TESSALON) 100 MG capsule Take 1 capsule (100 mg total) by mouth every 8 (eight) hours. 03/12/22  Yes Rilee Knoll, DO  ?apixaban (ELIQUIS) 2.5 MG TABS tablet Take 2.5 mg by mouth 2 (two) times daily.    [provider]  ?buPROPion (WELLBUTRIN XL) 150 MG 24 hr tablet Take 150 mg by mouth daily.    [provider]  ?cetirizine (ZYRTEC) 10 MG tablet Take 10 mg by mouth daily. Takes in am    [provider]  ?doxycycline (DORYX) 100 MG EC tablet Take 100 mg by mouth 2 (two) times daily.    [provider]  ?fluticasone (FLONASE) 50 MCG/ACT nasal spray Place 1 spray into both nostrils daily.    [provider]  ?gabapentin (NEURONTIN) 300 MG capsule Take 300 mg by mouth 3 (three) times daily.    [provider]  ?metoprolol tartrate (LOPRESSOR) 25 MG tablet Take 25 mg by mouth 2 (two) times daily.    [provider]  ?modafinil (PROVIGIL) 200 MG tablet Take 200 mg by mouth daily. Takes in am    [provider]  ?montelukast (SINGULAIR) 10 MG tablet Take 10 mg by mouth at bedtime.    [provider]  ?ondansetron (ZOFRAN) 4 MG tablet Take 1 tablet (4 mg total) by mouth every 8 (eight) hours as needed for nausea or vomiting. 05/31/20    Wallene Huh, DPM  ?oxyCODONE-acetaminophen (PERCOCET) 10-325 MG tablet Take 1 tablet by mouth every 4 (four) hours as needed for pain. 05/31/20   Wallene Huh, DPM  ?pantoprazole (PROTONIX) 20 MG tablet Take 20 mg by mouth daily.    [provider]  ?VENLAFAXINE HCL PO Take 150 mg by mouth daily.    [provider]  ?VITAMIN D PO Take 10,000 Units by mouth 2 (two) times a week.    [provider]  ?   ? ?Allergies    ?Sulfa antibiotics   ? ?Review of Systems   ?Review of Systems ? ?Physical Exam ?Updated Vital Signs ?BP (!) 156/97   Pulse 89   Temp 98.2 ?F (36.8 ?C)   Resp 19   LMP  (LMP Unknown)   SpO2 96%  ?Physical Exam ?Vitals and nursing note reviewed.  ?Constitutional:   ?   General: She is not in acute distress. ?   Appearance: She is well-developed. She is not ill-appearing.  ?HENT:  ?   Head: Normocephalic and atraumatic.  ?   Nose: Nose normal.  ?   Mouth/Throat:  ?   Mouth: Mucous membranes are moist.  ?Eyes:  ?   Extraocular Movements: Extraocular movements intact.  ?   Conjunctiva/sclera: Conjunctivae normal.  ?   Pupils: Pupils  are equal, round, and reactive to light.  ?Cardiovascular:  ?   Rate and Rhythm: Normal rate and regular rhythm.  ?   Pulses: Normal pulses.  ?   Heart sounds: Normal heart sounds. No murmur heard. ?Pulmonary:  ?   Effort: Pulmonary effort is normal. No respiratory distress.  ?   Breath sounds: Normal breath sounds.  ?Abdominal:  ?   General: Abdomen is flat.  ?   Palpations: Abdomen is soft.  ?   Tenderness: There is no abdominal tenderness.  ?Musculoskeletal:     ?   General: No swelling.  ?   Cervical back: Neck supple.  ?Skin: ?   General: Skin is warm and dry.  ?   Capillary Refill: Capillary refill takes less than 2 seconds.  ?Neurological:  ?   General: No focal deficit present.  ?   Mental Status: She is alert.  ?Psychiatric:     ?   Mood and Affect: Mood normal.  ? ? ?ED Results / Procedures / Treatments   ?Labs ?(all labs ordered  are listed, but only abnormal results are displayed) ?Labs Reviewed - No data to display ? ?EKG ?None ? ?Radiology ?DG Chest Portable 1 View ? ?Result Date: 03/12/2022 ?CLINICAL DATA:  Positive COVID test. Respiratory symptoms for 1 week. EXAM: PORTABLE CHEST 1 VIEW COMPARISON:  One-view chest x-ray 10/12/2019 FINDINGS: Heart size is normal. Chronic interstitial coarsening is present. Minimal bibasilar airspace opacities are present. Lung volumes are low. Axial skeleton demonstrates mild degenerative change. Resection of the distal right clavicle again noted. IMPRESSION: Minimal bibasilar airspace disease likely reflects atelectasis. Infection is considered less likely. Electronically Signed   By: San Morelle M.D.   On: 03/12/2022 12:18   ? ?Procedures ?Procedures  ? ? ?Medications Ordered in ED ?Medications  ?benzonatate (TESSALON) capsule 200 mg (200 mg Oral Given 03/12/22 1220)  ?albuterol (VENTOLIN HFA) 108 (90 Base) MCG/ACT inhaler 2 puff (2 puffs Inhalation Given 03/12/22 1217)  ?dexamethasone (DECADRON) tablet 10 mg (10 mg Oral Given 03/12/22 1220)  ? ? ?ED Course/ Medical Decision Making/ A&P ?  ?                        ?Medical Decision Making ?Amount and/or Complexity of Data Reviewed ?Radiology: ordered. ? ?Risk ?Prescription drug management. ? ? ?Christina Harrington is here with cough.  Tested positive for COVID over the weekend.  Has had symptoms for over 5 days.  Using inhaler with some improvement of her cough.  Overall she appears well.  Normal vitals.  No signs of respiratory distress.  Chest x-ray per my review and interpretation shows no obvious pneumonia.  No pneumothorax.  Cough has been a big problem for her.  We will give her dose of Decadron, albuterol and Tessalon for symptomatic support.  Overall may be mild asthma exacerbation but she shows no signs of respiratory distress.  She is fully vaccinated.  She understands return precautions.  Discharged in good condition. ? ?This chart was  dictated using voice recognition software.  Despite best efforts to proofread,  errors can occur which can change the documentation meaning.  ? ? ? ? ? ? ? ?Final Clinical Impression(s) / ED Diagnoses ?Final diagnoses:  ?COVID-19  ? ? ?Rx / DC Orders ?ED Discharge Orders   ? ?      Ordered  ?  benzonatate (TESSALON) 100 MG capsule  Every 8 hours       ?  03/12/22 1227  ? ?  ?  ? ?  ? ? ?  ?Lennice Sites, DO ?03/12/22 1228 ? ?

## 2022-03-12 NOTE — ED Triage Notes (Signed)
Patient reports to the ER for COVID positive since Saturday but reports symptoms since Tuesday. Patient reports fatigue, muscle aches, headaches. Patient reports she called the triage hotline and was told to come to the ER for evaluation.  ?

## 2022-03-12 NOTE — Discharge Instructions (Signed)
Chest x-ray without any evidence of pneumonia.  Continue using albuterol as needed.  You have been treated with a long-acting steroid which should help with your cough.  Take Tessalon Perles as needed for cough as well. ?

## 2022-03-14 ENCOUNTER — Encounter (HOSPITAL_COMMUNITY): Payer: Self-pay | Admitting: Internal Medicine

## 2022-03-14 ENCOUNTER — Observation Stay (HOSPITAL_COMMUNITY)
Admission: EM | Admit: 2022-03-14 | Discharge: 2022-03-15 | Disposition: A | Discharge status: Home / Self Care | Payer: Medicare Other | Attending: Internal Medicine | Admitting: Internal Medicine

## 2022-03-14 ENCOUNTER — Emergency Department (HOSPITAL_COMMUNITY): Payer: Medicare Other

## 2022-03-14 DIAGNOSIS — Z86711 Personal history of pulmonary embolism: Secondary | ICD-10-CM | POA: Insufficient documentation

## 2022-03-14 DIAGNOSIS — I2699 Other pulmonary embolism without acute cor pulmonale: Secondary | ICD-10-CM | POA: Diagnosis present

## 2022-03-14 DIAGNOSIS — F32A Depression, unspecified: Secondary | ICD-10-CM | POA: Diagnosis not present

## 2022-03-14 DIAGNOSIS — U071 COVID-19: Secondary | ICD-10-CM | POA: Diagnosis not present

## 2022-03-14 DIAGNOSIS — I2694 Multiple subsegmental pulmonary emboli without acute cor pulmonale: Secondary | ICD-10-CM

## 2022-03-14 DIAGNOSIS — J45909 Unspecified asthma, uncomplicated: Secondary | ICD-10-CM | POA: Insufficient documentation

## 2022-03-14 DIAGNOSIS — I10 Essential (primary) hypertension: Secondary | ICD-10-CM | POA: Diagnosis not present

## 2022-03-14 DIAGNOSIS — R0602 Shortness of breath: Secondary | ICD-10-CM | POA: Diagnosis not present

## 2022-03-14 DIAGNOSIS — E119 Type 2 diabetes mellitus without complications: Secondary | ICD-10-CM | POA: Diagnosis not present

## 2022-03-14 DIAGNOSIS — Z79899 Other long term (current) drug therapy: Secondary | ICD-10-CM | POA: Insufficient documentation

## 2022-03-14 DIAGNOSIS — I2609 Other pulmonary embolism with acute cor pulmonale: Secondary | ICD-10-CM | POA: Diagnosis not present

## 2022-03-14 DIAGNOSIS — R079 Chest pain, unspecified: Secondary | ICD-10-CM | POA: Diagnosis present

## 2022-03-14 LAB — COMPREHENSIVE METABOLIC PANEL
ALT: 19 U/L (ref 0–44)
AST: 21 U/L (ref 15–41)
Albumin: 3.6 g/dL (ref 3.5–5.0)
Alkaline Phosphatase: 78 U/L (ref 38–126)
Anion gap: 7 (ref 5–15)
BUN: 16 mg/dL (ref 8–23)
CO2: 23 mmol/L (ref 22–32)
Calcium: 9.1 mg/dL (ref 8.9–10.3)
Chloride: 109 mmol/L (ref 98–111)
Creatinine, Ser: 0.69 mg/dL (ref 0.44–1.00)
GFR, Estimated: >60 mL/min (ref >60)
Glucose, Bld: 100 mg/dL — ABNORMAL HIGH (ref 70–99)
Potassium: 3.7 mmol/L (ref 3.5–5.1)
Sodium: 139 mmol/L (ref 135–145)
Total Bilirubin: 0.6 mg/dL (ref 0.3–1.2)
Total Protein: 6.4 g/dL — ABNORMAL LOW (ref 6.5–8.1)

## 2022-03-14 LAB — CBC WITH DIFFERENTIAL/PLATELET
Abs Immature Granulocytes: 0.07 10*3/uL (ref 0.00–0.07)
Basophils Absolute: 0.1 10*3/uL (ref 0.0–0.1)
Basophils Relative: 1 %
Eosinophils Absolute: 0.1 10*3/uL (ref 0.0–0.5)
Eosinophils Relative: 2 %
HCT: 43.1 % (ref 36.0–46.0)
Hemoglobin: 14 g/dL (ref 12.0–15.0)
Immature Granulocytes: 1 %
Lymphocytes Relative: 33 %
Lymphs Abs: 2.5 10*3/uL (ref 0.7–4.0)
MCH: 29.2 pg (ref 26.0–34.0)
MCHC: 32.5 g/dL (ref 30.0–36.0)
MCV: 89.8 fL (ref 80.0–100.0)
Monocytes Absolute: 0.5 10*3/uL (ref 0.1–1.0)
Monocytes Relative: 7 %
Neutro Abs: 4.4 10*3/uL (ref 1.7–7.7)
Neutrophils Relative %: 56 %
Platelets: 278 10*3/uL (ref 150–400)
RBC: 4.8 MIL/uL (ref 3.87–5.11)
RDW: 13.5 % (ref 11.5–15.5)
WBC: 7.7 10*3/uL (ref 4.0–10.5)
nRBC: 0 % (ref 0.0–0.2)

## 2022-03-14 LAB — TROPONIN I (HIGH SENSITIVITY)
Troponin I (High Sensitivity): 3 ng/L (ref <18)
Troponin I (High Sensitivity): <2 ng/L (ref <18)

## 2022-03-14 LAB — BRAIN NATRIURETIC PEPTIDE: B Natriuretic Peptide: 42.2 pg/mL (ref 0.0–100.0)

## 2022-03-14 LAB — PROTIME-INR
INR: 1 (ref 0.8–1.2)
Prothrombin Time: 13.3 seconds (ref 11.4–15.2)

## 2022-03-14 LAB — D-DIMER, QUANTITATIVE: D-Dimer, Quant: 2.2 ug/mL-FEU — ABNORMAL HIGH (ref 0.00–0.50)

## 2022-03-14 LAB — APTT: aPTT: 24 seconds (ref 24–36)

## 2022-03-14 MED ORDER — FLUTICASONE PROPIONATE 50 MCG/ACT NA SUSP
1.0000 | Freq: Every day | NASAL | Status: DC
  Administered 2022-03-15: 1 via NASAL
  Filled 2022-03-14: qty 16

## 2022-03-14 MED ORDER — PNEUMOCOCCAL 20-VAL CONJ VACC 0.5 ML IM SUSY
0.5000 mL | PREFILLED_SYRINGE | INTRAMUSCULAR | Status: DC
  Filled 2022-03-14: qty 0.5

## 2022-03-14 MED ORDER — BUPROPION HCL ER (XL) 150 MG PO TB24
150.0000 mg | ORAL_TABLET | Freq: Every day | ORAL | Status: DC
  Administered 2022-03-15: 150 mg via ORAL
  Filled 2022-03-14: qty 1

## 2022-03-14 MED ORDER — GUAIFENESIN 100 MG/5ML PO LIQD
10.0000 mL | Freq: Four times a day (QID) | ORAL | Status: DC | PRN
  Filled 2022-03-14: qty 10

## 2022-03-14 MED ORDER — ONDANSETRON HCL 4 MG PO TABS: 4.0000 mg | ORAL_TABLET | Freq: Four times a day (QID) | ORAL | Status: DC | PRN

## 2022-03-14 MED ORDER — IOHEXOL 350 MG/ML SOLN
80.0000 mL | Freq: Once | INTRAVENOUS | Status: AC | PRN
  Administered 2022-03-14: 80 mL via INTRAVENOUS

## 2022-03-14 MED ORDER — LORATADINE 10 MG PO TABS
10.0000 mg | ORAL_TABLET | Freq: Every day | ORAL | Status: DC
  Administered 2022-03-15: 10 mg via ORAL
  Filled 2022-03-14: qty 1

## 2022-03-14 MED ORDER — ONDANSETRON HCL 4 MG/2ML IJ SOLN: 4.0000 mg | Freq: Four times a day (QID) | INTRAMUSCULAR | Status: DC | PRN

## 2022-03-14 MED ORDER — GABAPENTIN 300 MG PO CAPS
300.0000 mg | ORAL_CAPSULE | Freq: Two times a day (BID) | ORAL | Status: DC
  Administered 2022-03-14 – 2022-03-15 (×2): 300 mg via ORAL
  Filled 2022-03-14 (×2): qty 1

## 2022-03-14 MED ORDER — SODIUM CHLORIDE (PF) 0.9 % IJ SOLN
INTRAMUSCULAR | Status: AC
  Filled 2022-03-14: qty 50

## 2022-03-14 MED ORDER — DOXYCYCLINE HYCLATE 100 MG PO TABS
100.0000 mg | ORAL_TABLET | Freq: Two times a day (BID) | ORAL | Status: DC
  Administered 2022-03-14 – 2022-03-15 (×2): 100 mg via ORAL
  Filled 2022-03-14 (×4): qty 1

## 2022-03-14 MED ORDER — ALBUTEROL SULFATE HFA 108 (90 BASE) MCG/ACT IN AERS
2.0000 | INHALATION_SPRAY | RESPIRATORY_TRACT | Status: DC | PRN
  Filled 2022-03-14: qty 6.7

## 2022-03-14 MED ORDER — BENZONATATE 100 MG PO CAPS
100.0000 mg | ORAL_CAPSULE | Freq: Three times a day (TID) | ORAL | Status: DC
  Administered 2022-03-14 – 2022-03-15 (×3): 100 mg via ORAL
  Filled 2022-03-14 (×3): qty 1

## 2022-03-14 MED ORDER — PANTOPRAZOLE SODIUM 40 MG PO TBEC
40.0000 mg | DELAYED_RELEASE_TABLET | Freq: Every day | ORAL | Status: DC
  Administered 2022-03-14 – 2022-03-15 (×2): 40 mg via ORAL
  Filled 2022-03-14 (×2): qty 1

## 2022-03-14 MED ORDER — MODAFINIL 200 MG PO TABS
200.0000 mg | ORAL_TABLET | Freq: Every day | ORAL | Status: DC
Start: 2022-03-15 — End: 2022-03-15
  Administered 2022-03-15: 200 mg via ORAL
  Filled 2022-03-14: qty 1

## 2022-03-14 MED ORDER — ACETAMINOPHEN 650 MG RE SUPP: 650.0000 mg | Freq: Four times a day (QID) | RECTAL | Status: DC | PRN

## 2022-03-14 MED ORDER — HEPARIN (PORCINE) 25000 UT/250ML-% IV SOLN
1300.0000 [IU]/h | INTRAVENOUS | Status: DC
  Administered 2022-03-14 – 2022-03-15 (×2): 1300 [IU]/h via INTRAVENOUS
  Filled 2022-03-14 (×2): qty 250

## 2022-03-14 MED ORDER — MONTELUKAST SODIUM 10 MG PO TABS
10.0000 mg | ORAL_TABLET | Freq: Every day | ORAL | Status: DC
Start: 2022-03-14 — End: 2022-03-15
  Administered 2022-03-14: 10 mg via ORAL
  Filled 2022-03-14: qty 1

## 2022-03-14 MED ORDER — ACETAMINOPHEN 325 MG PO TABS
650.0000 mg | ORAL_TABLET | Freq: Once | ORAL | Status: AC
Start: 2022-03-14 — End: 2022-03-14
  Administered 2022-03-14: 650 mg via ORAL
  Filled 2022-03-14: qty 2

## 2022-03-14 MED ORDER — ACETAMINOPHEN 325 MG PO TABS: 650.0000 mg | ORAL_TABLET | Freq: Four times a day (QID) | ORAL | Status: DC | PRN

## 2022-03-14 MED ORDER — HEPARIN BOLUS VIA INFUSION
5000.0000 [IU] | Freq: Once | INTRAVENOUS | Status: AC
  Administered 2022-03-14: 5000 [IU] via INTRAVENOUS
  Filled 2022-03-14: qty 5000

## 2022-03-14 NOTE — ED Notes (Signed)
Pt walked to the bathroom with assistance.

## 2022-03-14 NOTE — H&P (Signed)
?History and Physical  ? ? ?Christina Harrington RFF:[id number] DOB: May 20, 1956 DOA: 03/14/2022 ? ?PCP: Greta Doom, MD  ?Patient coming from: Home ? ?I have personally briefly reviewed patient's old medical records available.  ? ?Chief Complaint: Shortness of breath and cough, chest pain ? ?HPI: Christina Harrington is a 66 y.o. female with medical history significant of chronic intermittent asthma, history of pulmonary embolism in 2017, fibromyalgia, anxiety and depression presenting with dry cough since initial diagnosis of COVID-19 10 days ago, sharp pleuritic chest pain, initially intermittent now constant, worse with deep breathing and coughing, feeling short of breath worsening over the past 2 days.  She does have a history of PE that was diagnosed in 2017 after she was using hormonal replacement therapy.  She had extensive genetic testing and was negative.  Patient took Eliquis for 2 years and since then stopped.  She became more mobile at that time. ?Diagnosed with COVID-19 at home about 10 days ago, visited ER 2 days ago with ongoing cough and shortness of breath, prescribed inhalers and Tessalon Perles and Decadron and sent home. ?No fever or chills.  No nausea vomiting.  Denies any headache, dizziness visual changes.  Denies any abdominal pain.  Denies any change in bowel or bladder habits. ?Patient does have some calf pain on the left Without swelling. ? ?ED Course: On room air.  Blood pressure stable.  Afebrile.  Troponins normal.  Electrolytes normal.  Chest x-ray with atelectasis.  D-dimer elevated.  CT angiogram with multiple right upper lobe and right middle lobe pulmonary embolism and multiple segmental pulmonary embolism with mild right heart strain.  Patient on room air.  Troponins normal. ?Started on heparin infusion.  Needs observation because of possible right heart strain.  Decided against thrombolysis due to relatively stable condition. ? ?Review of Systems: all systems are reviewed and  pertinent positive as per HPI otherwise rest are negative.  ? ? ?Past Medical History:  ?Diagnosis Date  ? Anxiety   ? Arthritis   ? Asthma   ? Depression   ? Fibromyalgia syndrome   ? Gestational diabetes   ? Hypertension   ? Pregnancy induced hypertension   ? Sleep apnea   ? Vaginal Pap smear, abnormal   ? ? ?Past Surgical History:  ?Procedure Laterality Date  ? BREAST SURGERY    ? TUBAL LIGATION    ? ? ?Social history  ? reports that she has never smoked. She has never used smokeless tobacco. She reports that she does not drink alcohol and does not use drugs. ? ?Allergies  ?Allergen Reactions  ? Sulfa Antibiotics Hives  ?  GI upset  ? ? ?Family History  ?Problem Relation Age of Onset  ? Hypertension Mother   ? Cancer Mother   ? Cancer Maternal Uncle   ? Breast cancer Paternal Aunt   ? Cancer Paternal Aunt   ? Hypertension Maternal Grandmother   ? ? ? ?Prior to Admission medications   ?Medication Sig Start Date End Date Taking? Authorizing Provider  ?ALBUTEROL SULFATE PO Take 1 puff by mouth.   Yes [provider]  ?benzonatate (TESSALON) 100 MG capsule Take 1 capsule (100 mg total) by mouth every 8 (eight) hours. 03/12/22  Yes Curatolo, Adam, DO  ?buPROPion (WELLBUTRIN XL) 150 MG 24 hr tablet Take 150 mg by mouth daily.   Yes [provider]  ?cetirizine (ZYRTEC) 10 MG tablet Take 10 mg by mouth daily. Takes in am   Yes [provider]  ?doxycycline (DORYX) 100 MG EC tablet Take 100 mg by mouth 2 (two) times daily.   Yes [provider]  ?esomeprazole (NEXIUM) 40 MG capsule Take 40 mg by mouth 2 (two) times daily.   Yes [provider]  ?famotidine (PEPCID) 40 MG tablet Take 40 mg by mouth 2 (two) times daily.   Yes [provider]  ?fluticasone (FLONASE) 50 MCG/ACT nasal spray Place 1 spray into both nostrils daily.   Yes [provider]  ?modafinil (PROVIGIL) 200 MG tablet Take 200 mg by mouth daily. Takes in am   Yes [provider]   ?montelukast (SINGULAIR) 10 MG tablet Take 10 mg by mouth at bedtime.   Yes [provider]  ?VITAMIN D PO Take 10,000 Units by mouth 2 (two) times a week.   Yes [provider]  ?gabapentin (NEURONTIN) 300 MG capsule Take 300 mg by mouth 2 (two) times daily.    [provider]  ?ondansetron (ZOFRAN) 4 MG tablet Take 1 tablet (4 mg total) by mouth every 8 (eight) hours as needed for nausea or vomiting. ?Patient not taking: Reported on 03/14/2022 05/31/20   Lenn Sinkegal, Norman S, DPM  ?oxyCODONE-acetaminophen (PERCOCET) 10-325 MG tablet Take 1 tablet by mouth every 4 (four) hours as needed for pain. ?Patient not taking: Reported on 03/14/2022 05/31/20   Lenn Sinkegal, Norman S, DPM  ? ? ?Physical Exam: ?Vitals:  ? 03/14/22 1618 03/14/22 1630 03/14/22 1654 03/14/22 1655  ?BP: (!) 128/93 (!) 128/93 (!) 144/71   ?Pulse: 88 75 84 84  ?Resp: 15 11 14 14   ?Temp:      ?TempSrc:      ?SpO2: 96% 95% 98% 98%  ?Weight:      ?Height:      ? ? ?Constitutional: NAD, calm, comfortable ?Vitals:  ? 03/14/22 1618 03/14/22 1630 03/14/22 1654 03/14/22 1655  ?BP: (!) 128/93 (!) 128/93 (!) 144/71   ?Pulse: 88 75 84 84  ?Resp: 15 11 14 14   ?Temp:      ?TempSrc:      ?SpO2: 96% 95% 98% 98%  ?Weight:      ?Height:      ? ?Physical Exam ?Constitutional:   ?   General: She is not in acute distress. ?   Appearance: She is well-developed.  ?HENT:  ?   Head: Atraumatic.  ?Eyes:  ?   Pupils: Pupils are equal, round, and reactive to light.  ?Cardiovascular:  ?   Rate and Rhythm: Normal rate and regular rhythm.  ?   Heart sounds: Normal heart sounds.  ?Pulmonary:  ?   Effort: Pulmonary effort is normal.  ?   Breath sounds: Normal breath sounds. No wheezing, rhonchi or rales.  ?Chest:  ?   Chest wall: No tenderness.  ?Abdominal:  ?   General: Bowel sounds are normal.  ?   Palpations: Abdomen is soft.  ?   Tenderness: There is no guarding.  ?Musculoskeletal:     ?   General: Normal range of motion.  ?   Cervical back: Normal range of motion  and neck supple.  ?   Right lower leg: No edema.  ?   Left lower leg: Tenderness present.  ?   Comments: Varicose veins and some tenderness along the left superficial veins.  Denna HaggardHomans' sign negative.  ?Skin: ?   General: Skin is warm and dry.  ?Neurological:  ?   General: No focal deficit present.  ?   Mental Status: She is  alert and oriented to person, place, and time.  ?Psychiatric:     ?   Mood and Affect: Mood normal.     ?   Behavior: Behavior normal.  ?  ? ? ? ?Labs on Admission: I have personally reviewed following labs and imaging studies ? ?CBC: ?Recent Labs  ?Lab 03/14/22 ?1125  ?WBC 7.7  ?NEUTROABS 4.4  ?HGB 14.0  ?HCT 43.1  ?MCV 89.8  ?PLT 278  ? ?Basic Metabolic Panel: ?Recent Labs  ?Lab 03/14/22 ?1125  ?NA 139  ?K 3.7  ?CL 109  ?CO2 23  ?GLUCOSE 100*  ?BUN 16  ?CREATININE 0.69  ?CALCIUM 9.1  ? ?GFR: ?Estimated Creatinine Clearance: 79.5 mL/min (by C-G formula based on SCr of 0.69 mg/dL). ?Liver Function Tests: ?Recent Labs  ?Lab 03/14/22 ?1125  ?AST 21  ?ALT 19  ?ALKPHOS 78  ?BILITOT 0.6  ?PROT 6.4*  ?ALBUMIN 3.6  ? ?No results for input(s): LIPASE, AMYLASE in the last 168 hours. ?No results for input(s): AMMONIA in the last 168 hours. ?Coagulation Profile: ?No results for input(s): INR, PROTIME in the last 168 hours. ?Cardiac Enzymes: ?No results for input(s): CKTOTAL, CKMB, CKMBINDEX, TROPONINI in the last 168 hours. ?BNP (last 3 results) ?No results for input(s): PROBNP in the last 8760 hours. ?HbA1C: ?No results for input(s): HGBA1C in the last 72 hours. ?CBG: ?No results for input(s): GLUCAP in the last 168 hours. ?Lipid Profile: ?No results for input(s): CHOL, HDL, LDLCALC, TRIG, CHOLHDL, LDLDIRECT in the last 72 hours. ?Thyroid Function Tests: ?No results for input(s): TSH, T4TOTAL, FREET4, T3FREE, THYROIDAB in the last 72 hours. ?Anemia Panel: ?No results for input(s): VITAMINB12, FOLATE, FERRITIN, TIBC, IRON, RETICCTPCT in the last 72 hours. ?Urine analysis: ?No results found for: COLORURINE,  APPEARANCEUR, LABSPEC, PHURINE, GLUCOSEU, HGBUR, BILIRUBINUR, KETONESUR, PROTEINUR, UROBILINOGEN, NITRITE, LEUKOCYTESUR ? ?Radiological Exams on Admission: ?DG Chest 2 View ? ?Result Date: 03/14/2022 ?CLINICAL DATA:  [facility] Nurse

## 2022-03-14 NOTE — ED Triage Notes (Signed)
Pt states she is having chest pain during activity. Pt also reports shortness of breath and productive cough. Covid positive 10 days ago. ?

## 2022-03-14 NOTE — ED Provider Notes (Signed)
[facility]-EMERGENCY DEPT Provider Note   CSN: [id number] Arrival date & time: 03/14/22  1030     History  Chief Complaint  Patient presents with   Chest Pain    Christina Harrington is a 66 y.o. female.   Chest Pain Associated symptoms: no fever    Patient presents to the emergency room for evaluation of chest pain.  Patient has a history of hypertension fibromyalgia, diabetes and a recent COVID-19 diagnosis.  Patient states she has been coughing since that time.  In the last couple days however she started having trouble with chest pain.  Pain is sharp but she also has a constant dull ache.  It increases when she coughs or takes a deep breath.  Patient started to feel short of breath.  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 Authorizing Provider  ALBUTEROL SULFATE PO Take 1 puff by mouth.   Yes [provider]  benzonatate (TESSALON) 100 MG capsule Take 1 capsule (100 mg total) by mouth every 8 (eight) hours. 03/12/22  Yes Curatolo, Adam, DO  buPROPion (WELLBUTRIN XL) 150 MG 24 hr tablet Take 150 mg by mouth daily.   Yes [provider]  cetirizine (ZYRTEC) 10 MG tablet Take 10 mg by mouth daily. Takes in am   Yes [provider]  doxycycline (DORYX) 100 MG EC tablet Take 100 mg by mouth 2 (two) times daily.   Yes [provider]  esomeprazole (NEXIUM) 40 MG capsule Take 40 mg by mouth 2 (two) times daily.   Yes [provider]  famotidine (PEPCID) 40 MG tablet Take 40 mg by mouth 2 (two) times daily.   Yes [provider]  fluticasone (FLONASE) 50 MCG/ACT nasal spray Place 1 spray into both nostrils daily.   Yes [provider]  modafinil (PROVIGIL) 200 MG tablet Take 200 mg by mouth daily. Takes in am   Yes [provider]  montelukast  (SINGULAIR) 10 MG tablet Take 10 mg by mouth at bedtime.   Yes [provider]  VITAMIN D PO Take 10,000 Units by mouth 2 (two) times a week.   Yes [provider]  gabapentin (NEURONTIN) 300 MG capsule Take 300 mg by mouth 2 (two) times daily.    [provider]  ondansetron (ZOFRAN) 4 MG tablet Take 1 tablet (4 mg total) by mouth every 8 (eight) hours as needed for nausea or vomiting. Patient not taking: Reported on 03/14/2022 05/31/20   Lenn Sink, DPM      Allergies    Sulfa antibiotics    Review of Systems   Review of Systems  Constitutional:  Negative for fever.  Cardiovascular:  Positive for chest pain.   Physical Exam Updated Vital Signs BP 127/82 (BP Location: Left Arm)   Pulse 76   Temp 97.9 F (36.6 C)   Resp 14   Ht 1.727 m (5\' 8" )   Wt 86.2 kg   LMP  (LMP Unknown)   SpO2 96%   BMI 28.89 kg/m  Physical Exam Vitals and nursing note reviewed.  Constitutional:      General: She is not in acute distress.    Appearance: She is well-developed.  HENT:     Head: Normocephalic and atraumatic.     Right Ear: External ear normal.  Left Ear: External ear normal.  Eyes:     General: No scleral icterus.       Right eye: No discharge.        Left eye: No discharge.     Conjunctiva/sclera: Conjunctivae normal.  Neck:     Trachea: No tracheal deviation.  Cardiovascular:     Rate and Rhythm: Normal rate and regular rhythm.  Pulmonary:     Effort: Pulmonary effort is normal. No respiratory distress.     Breath sounds: Normal breath sounds. No stridor. No wheezing or rales.  Abdominal:     General: Bowel sounds are normal. There is no distension.     Palpations: Abdomen is soft.     Tenderness: There is no abdominal tenderness. There is no guarding or rebound.  Musculoskeletal:        General: No tenderness or deformity.     Cervical back: Neck supple.  Skin:    General: Skin is warm and dry.     Findings: No rash.  Neurological:      General: No focal deficit present.     Mental Status: She is alert.     Cranial Nerves: No cranial nerve deficit (no facial droop, extraocular movements intact, no slurred speech).     Sensory: No sensory deficit.     Motor: No abnormal muscle tone or seizure activity.     Coordination: Coordination normal.  Psychiatric:        Mood and Affect: Mood normal.    ED Results / Procedures / Treatments   Labs (all labs ordered are listed, but only abnormal results are displayed) Labs Reviewed  COMPREHENSIVE METABOLIC PANEL - Abnormal; Notable for the following components:      Result Value   Glucose, Bld 100 (*)    Total Protein 6.4 (*)    All other components within normal limits  D-DIMER, QUANTITATIVE - Abnormal; Notable for the following components:   D-Dimer, Quant 2.20 (*)    All other components within normal limits  CBC WITH DIFFERENTIAL/PLATELET  BRAIN NATRIURETIC PEPTIDE  PROTIME-INR  APTT  HEPARIN LEVEL (UNFRACTIONATED)  CBC  HIV ANTIBODY (ROUTINE TESTING W REFLEX)  HEPARIN LEVEL (UNFRACTIONATED)  TROPONIN I (HIGH SENSITIVITY)  TROPONIN I (HIGH SENSITIVITY)    EKG EKG Interpretation  Date/Time:  Wednesday Mar 14 2022 10:52:45 EDT Ventricular Rate:  91 PR Interval:  138 QRS Duration: 90 QT Interval:  355 QTC Calculation: 437 R Axis:   31 Text Interpretation: Sinus rhythm Low voltage, precordial leads Baseline wander in lead(s) I III aVL Confirmed by Linwood Dibbles [phone]) on 03/15/2022 7:17:42 AM  Radiology DG Chest 2 View  Result Date: 03/14/2022 CLINICAL DATA:  Chest pain, shortness of breath, cough EXAM: CHEST - 2 VIEW COMPARISON:  03/12/2022 FINDINGS: Cardiac size is within normal limits. Lung fields are clear of any infiltrates or pulmonary edema. Small linear densities in the left lower lung fields may suggest minimal scarring or minimal subsegmental atelectasis. There is no pleural effusion or pneumothorax. Deformity in the lateral end of right clavicle may be  residual from previous injury. IMPRESSION: Small linear densities in the left lower lung fields suggest scarring or subsegmental atelectasis. There are no signs of pulmonary edema or focal pulmonary consolidation. Electronically Signed   By: Ernie Avena M.D.   On: 03/14/2022 13:08   CT Angio Chest PE W and/or Wo Contrast  Result Date: 03/14/2022 CLINICAL DATA:  Elevated D-dimer, chest pain and shortness of breath. COVID positive 10 days ago.  EXAM: CT ANGIOGRAPHY CHEST WITH CONTRAST TECHNIQUE: Multidetector CT imaging of the chest was performed using the standard protocol during bolus administration of intravenous contrast. Multiplanar CT image reconstructions and MIPs were obtained to evaluate the vascular anatomy. RADIATION DOSE REDUCTION: This exam was performed according to the departmental dose-optimization program which includes automated exposure control, adjustment of the mA and/or kV according to patient size and/or use of iterative reconstruction technique. CONTRAST:  34mL OMNIPAQUE IOHEXOL 350 MG/ML SOLN COMPARISON:  CTA chest 10/12/2019 FINDINGS: Cardiovascular: Acute pulmonary embolus is present with lobar clot in the right upper lobe and right lower lobe pulmonary arteries and multiple segmental pulmonary emboli in the left upper lobe and left lower lobe pulmonary arteries. Right ventricular to left ventricular ratio 1.01. Mediastinum/Nodes: Unremarkable Lungs/Pleura: Stable small subpleural nodules along the minor fissure, no change from 12/12/2018, considered benign. Mild dependent atelectasis in both lower lobes. Mild subsegmental atelectasis posteriorly in the lingula. Upper Abdomen: Cholecystectomy. Musculoskeletal: Thoracic spondylosis. Review of the MIP images confirms the above findings. IMPRESSION: 1. Lobar clot in the right upper lobe and right lower lobe pulmonary arteries with multiple segmental pulmonary emboli in the left lung. Positive for acute PE with CT evidence of right  heart strain (RV/LV Ratio = 1.01) consistent with at least submassive (intermediate risk) PE. The presence of right heart strain has been associated with an increased risk of morbidity and mortality. Please refer to the "Code PE Focused" order set in EPIC. 2. Mild dependent atelectasis in both lower lobes and in the lingula. Critical Value/emergent results were called by telephone at the time of interpretation on 03/14/2022 at 5:05 pm to provider [facility] , who verbally acknowledged these results. Electronically Signed   By: Gaylyn Rong M.D.   On: 03/14/2022 17:05    Procedures Procedures    Medications Ordered in ED Medications  sodium chloride (PF) 0.9 % injection (has no administration in time range)  heparin ADULT infusion 100 units/mL (25000 units/24mL) (1,300 Units/hr Intravenous New Bag/Given 03/14/22 1736)  montelukast (SINGULAIR) tablet 10 mg (10 mg Oral Given 03/14/22 2122)  gabapentin (NEURONTIN) capsule 300 mg (300 mg Oral Given 03/14/22 2122)  buPROPion (WELLBUTRIN XL) 24 hr tablet 150 mg (has no administration in time range)  loratadine (CLARITIN) tablet 10 mg (has no administration in time range)  benzonatate (TESSALON) capsule 100 mg (100 mg Oral Given 03/15/22 0630)  fluticasone (FLONASE) 50 MCG/ACT nasal spray 1 spray (has no administration in time range)  modafinil (PROVIGIL) tablet 200 mg (has no administration in time range)  doxycycline (VIBRA-TABS) tablet 100 mg (100 mg Oral Given 03/14/22 2225)  pantoprazole (PROTONIX) EC tablet 40 mg (40 mg Oral Given 03/14/22 2122)  acetaminophen (TYLENOL) tablet 650 mg (has no administration in time range)    Or  acetaminophen (TYLENOL) suppository 650 mg (has no administration in time range)  ondansetron (ZOFRAN) tablet 4 mg (has no administration in time range)    Or  ondansetron (ZOFRAN) injection 4 mg (has no administration in time range)  albuterol (VENTOLIN HFA) 108 (90 Base) MCG/ACT inhaler 2 puff (has no administration in  time range)  guaiFENesin (ROBITUSSIN) 100 MG/5ML liquid 10 mL (has no administration in time range)  pneumococcal 20-valent conjugate vaccine (PREVNAR 20) injection 0.5 mL (has no administration in time range)  acetaminophen (TYLENOL) tablet 650 mg (650 mg Oral Given 03/14/22 1348)  iohexol (OMNIPAQUE) 350 MG/ML injection 80 mL (80 mLs Intravenous Contrast Given 03/14/22 1641)  heparin bolus via infusion 5,000 Units (5,000 Units  Intravenous Bolus from Bag 03/14/22 1736)    ED Course/ Medical Decision Making/ A&P Clinical Course as of 03/15/22 0718  Wed Mar 14, 2022  1352 DG Chest 2 View Chest x-ray images and radiology report reviewed.  No evidence of pneumonia [JK]  1358 CBC with Differential Normal [JK]  1358 Troponin I (High Sensitivity) Serial troponins normal [JK]  1358 Comprehensive metabolic panel(!) Normal [JK]  1359 D-dimer, quantitative(!) D-dimer elevated [JK]  1708 Discussed case with radiology Dr. Ova FreshwaterLiebkemann.  Patient does have signs of RV strain. [JK]  1708 Simplified Pesi score is low risk.  No elevated biomarkers.  Not a code PE candidate at this time per our algorithm [JK]  1723 Case discussed with Dr Jerral RalphGhimire [JK]    Clinical Course User Index [JK] Linwood DibblesKnapp, Meridian Scherger, MD                           Medical Decision Making Patient presents with chest pain.  Differential diagnosis includes but not limited to acute coronary syndrome, pulmonary embolism, pleurisy  Amount and/or Complexity of Data Reviewed Labs: ordered. Decision-making details documented in ED Course. Radiology: ordered and independent interpretation performed. Decision-making details documented in ED Course.  Risk OTC drugs. Prescription drug management. Decision regarding hospitalization.   Patient presented to ED with complaints of chest discomfort in the setting of recent COVID diagnosis.  Patient had been coughing recently.  Patient has known history of PE and felt that her symptoms were similar to  that.  Patient without tachycardia or hypertension.  No signs of distress no oxygen requirement.  Initial presentation concerning for the possibility of PE, pneumonia, pneumothorax less likely ACS.  ED work-up did not show any evidence of elevated troponin.  Chest x-ray did not show pneumonia.  PE study did show a pulmonary embolism.  Patient started on IV heparin.  CT scan did suggest the possibility of rv strain but otherwise low risk simplified Pesi score and no elevated biomarkers.  No indication for thrombolysis at this time.  Case discussed with medical service for admission        Final Clinical Impression(s) / ED Diagnoses Final diagnoses:  Acute pulmonary embolism without acute cor pulmonale, unspecified pulmonary embolism type (HCC)  Hypertension, unspecified type    Rx / DC Orders ED Discharge Orders     None         Linwood DibblesKnapp, Fredericka Bottcher, MD 03/15/22 [phone]

## 2022-03-14 NOTE — Progress Notes (Signed)
ANTICOAGULATION CONSULT NOTE - Initial Consult ? ?Pharmacy Consult for Heparin ?Indication: pulmonary embolus ? ?Allergies  ?Allergen Reactions  ? Sulfa Antibiotics Hives  ?  GI upset  ? ? ?Patient Measurements: ?Height: 5\' 8"  (172.7 cm) ?Weight: 86.2 kg (190 lb) ?IBW/kg (Calculated) : 63.9 ?Heparin Dosing Weight: 81.8 kg ? ?Vital Signs: ?Temp: 98.4 ?F (36.9 ?C) (05/17 1036) ?Temp Source: Oral (05/17 1036) ?BP: 144/71 (05/17 1654) ?Pulse Rate: 84 (05/17 1655) ? ?Labs: ?Recent Labs  ?  03/14/22 ?1125 03/14/22 ?1254  ?HGB 14.0  --   ?HCT 43.1  --   ?PLT 278  --   ?CREATININE 0.69  --   ?TROPONINIHS 3 <2  ? ? ?Estimated Creatinine Clearance: 79.5 mL/min (by C-G formula based on SCr of 0.69 mg/dL). ? ? ?Medical History: ?Past Medical History:  ?Diagnosis Date  ? Anxiety   ? Arthritis   ? Asthma   ? Depression   ? Fibromyalgia syndrome   ? Gestational diabetes   ? Hypertension   ? Pregnancy induced hypertension   ? Sleep apnea   ? Vaginal Pap smear, abnormal   ? ? ?Medications:  ?Scheduled:  ? heparin  5,000 Units Intravenous Once  ? sodium chloride (PF)      ? ?Infusions:  ? heparin    ? ?PRN:  ? ?Assessment: ?66 yo female with HTN, fibromyalgia, DM, hx prior PE and recent COVID-19 presents with chest pain.  CTa shows +PE with RHS.  Pharmacy consulted to dose IV heparin.  No anticoagulants noted PTA.  Baseline CBC WNL. ? ?Goal of Therapy:  ?Heparin level 0.3-0.7 units/ml ?Monitor platelets by anticoagulation protocol: Yes ?  ?Plan:  ?Give 5000 units bolus x 1 ?Start heparin infusion at 1300 units/hr ?Check anti-Xa level in 8 hours and daily while on heparin ?Continue to monitor H&H and platelets ? ?Peggyann Juba, PharmD, BCPS ?Pharmacy: [phone] ?03/14/2022,5:12 PM ? ? ?

## 2022-03-14 NOTE — Progress Notes (Signed)
Pt states COVID symptoms began last Tuesday (03/07/22). Pt stated that she tested positive for COVID on Saturday (03/10/22). ?

## 2022-03-14 NOTE — ED Provider Triage Note (Signed)
Emergency Medicine Provider Triage Evaluation Note  Donnelle Rubey , a 66 y.o. female  was evaluated in triage.  Pt complains of SOB and chest pain for the past 8 days, but worsened last night. Diagnosed with COVID recently. 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  Review of Systems  Positive:  Negative:   Physical Exam  BP (!) 151/99 (BP Location: Left Arm)   Pulse 93   Temp 98.4 F (36.9 C) (Oral)   Resp 18   Ht 5\' 8"  (1.727 m)   Wt 86.2 kg   LMP  (LMP Unknown)   SpO2 96%   BMI 28.89 kg/m  Gen:   Awake, no distress   Resp:  Normal effort  MSK:   Moves extremities without difficulty  Other:  RRR, CTAB  Medical Decision Making  Medically screening exam initiated at 11:06 AM.  Appropriate orders placed.  Oda Placke was informed that the remainder of the evaluation will be completed by another provider, this initial triage assessment does not replace that evaluation, and the importance of remaining in the ED until their evaluation is complete.  Will order D-dimer and basic labs as well as CXR>    Colonel Bald, Achille Rich 03/14/22 1107

## 2022-03-15 ENCOUNTER — Observation Stay (HOSPITAL_BASED_OUTPATIENT_CLINIC_OR_DEPARTMENT_OTHER): Payer: Medicare Other

## 2022-03-15 ENCOUNTER — Other Ambulatory Visit (HOSPITAL_COMMUNITY): Payer: Self-pay

## 2022-03-15 DIAGNOSIS — I2694 Multiple subsegmental pulmonary emboli without acute cor pulmonale: Secondary | ICD-10-CM | POA: Diagnosis not present

## 2022-03-15 DIAGNOSIS — I2609 Other pulmonary embolism with acute cor pulmonale: Secondary | ICD-10-CM | POA: Diagnosis not present

## 2022-03-15 LAB — CBC
HCT: 41.5 % (ref 36.0–46.0)
Hemoglobin: 13.5 g/dL (ref 12.0–15.0)
MCH: 29.2 pg (ref 26.0–34.0)
MCHC: 32.5 g/dL (ref 30.0–36.0)
MCV: 89.6 fL (ref 80.0–100.0)
Platelets: 254 10*3/uL (ref 150–400)
RBC: 4.63 MIL/uL (ref 3.87–5.11)
RDW: 13.7 % (ref 11.5–15.5)
WBC: 7.7 10*3/uL (ref 4.0–10.5)
nRBC: 0 % (ref 0.0–0.2)

## 2022-03-15 LAB — ECHOCARDIOGRAM COMPLETE
AR max vel: 5.46 cm2
AV Area VTI: 5.41 cm2
AV Area mean vel: 5.29 cm2
AV Mean grad: 2 mmHg
AV Peak grad: 3.9 mmHg
Ao pk vel: 0.99 m/s
Area-P 1/2: 3.99 cm2
Calc EF: 60.5 %
Height: 68 in
MV VTI: 6.45 cm2
S' Lateral: 3.1 cm
Single Plane A2C EF: 66 %
Single Plane A4C EF: 51.9 %
Weight: 3040 oz

## 2022-03-15 LAB — HIV ANTIBODY (ROUTINE TESTING W REFLEX): HIV Screen 4th Generation wRfx: NONREACTIVE

## 2022-03-15 LAB — HEPARIN LEVEL (UNFRACTIONATED)
Heparin Unfractionated: 0.66 IU/mL (ref 0.30–0.70)
Heparin Unfractionated: 0.62 IU/mL (ref 0.30–0.70)

## 2022-03-15 MED ORDER — TRAMADOL HCL 50 MG PO TABS
50.0000 mg | ORAL_TABLET | Freq: Four times a day (QID) | ORAL | 0 refills | Status: AC | PRN
Start: 2022-03-15 — End: 2022-03-20
  Filled 2022-03-15: qty 12, 5d supply, fill #0

## 2022-03-15 MED ORDER — APIXABAN 5 MG PO TABS
10.0000 mg | ORAL_TABLET | Freq: Once | ORAL | Status: AC
  Administered 2022-03-15: 10 mg via ORAL
  Filled 2022-03-15 (×2): qty 2

## 2022-03-15 MED ORDER — APIXABAN (ELIQUIS) VTE STARTER PACK (10MG AND 5MG)
ORAL_TABLET | ORAL | 2 refills | Status: DC
  Filled 2022-03-15: qty 74, 30d supply, fill #0

## 2022-03-15 NOTE — Discharge Summary (Signed)
Physician Discharge Summary  Christina BaldLisa Ann Harrington ZOX:[id number] DOB: 04/01/1956 DOA: 03/14/2022  PCP: Greta Doomapp, Sheila E, MD  Admit date: 03/14/2022 Discharge date: 03/15/2022  Admitted From: Home Disposition: Home  Recommendations for Outpatient Follow-up:  Follow up with PCP in 1-2 weeks Take blood thinners without interruption.  Follow-up with primary care physician to continue dosing.  Home Health: N/A Equipment/Devices: N/A  Discharge Condition: Stable CODE STATUS: Full code Diet recommendation: Regular diet  Discharge summary:  66 year old female with history of chronic intermittent asthma, history of pulmonary embolism in 2017 thought to be due to using hormone replacement therapy, anxiety depression, fibromyalgia who was diagnosed with COVID-19 about 10 days ago, continued to have cough and shortness of breath worse for last 2 days so came to the emergency room.  She was on room air.  Troponins were normal.  Electrolytes are normal.  Chest x-ray with atelectasis.  D-dimer was elevated.  CT angiogram with multiple right upper lobe and right middle lobe pulmonary embolism and multiple segmental pulmonary embolism with mild right heart strain.  She was on room air and is stable so started on IV heparin and admitted to the hospital.  Subsequent echocardiogram showed no evidence of right ventricular strain.  Mobilizing around on room air.  Chest pain from coughing.  Pulmonary embolism without cor pulmonale, second episode.  Both with large clot burden. Currently hemodynamically stable.  On room air.  Treated with heparin.  Will change to Eliquis loading dose and subsequent maintenance dose. Previous submassive PE in 2017 that was thought to be started because of hormone replacement therapy.  She had extensive genetic work-up that was negative.  This episode was likely contributed by COVID-19 infection, however given large clot burden she may likely need to be on lifelong anticoagulation therapy  as much as she can tolerate. She will follow-up for continuation of care with her PCP.  Recent COVID-19 infection: Symptomatic treatment.  Inhalers.  Bronchodilators.  Cough medications.  Chronic medical issues including depression and chronic intermittent asthma: Currently stable.  On Wellbutrin.  She is on albuterol, long-acting steroids inhalers and Singulair that she will continue.  Stable for discharge.  Discharge Diagnoses:  Principal Problem:   Pulmonary embolism (HCC) Active Problems:   Depression   Asthma, chronic   COVID-19 virus infection    Discharge Instructions  Discharge Instructions     Call MD for:  difficulty breathing, headache or visual disturbances   Complete by: As directed    Call MD for:  extreme fatigue   Complete by: As directed    Call MD for:  persistant dizziness or light-headedness   Complete by: As directed    Call MD for:  severe uncontrolled pain   Complete by: As directed    Diet general   Complete by: As directed    Discharge instructions   Complete by: As directed    You can take over-the-counter cough medications. Take blood thinners without interruption.   Increase activity slowly   Complete by: As directed       Allergies as of 03/15/2022       Reactions   Sulfa Antibiotics Hives   GI upset        Medication List     TAKE these medications    ALBUTEROL SULFATE PO Take 1 puff by mouth.   Apixaban Starter Pack (10mg  and 5mg ) Commonly known as: ELIQUIS STARTER PACK Take as directed on package: start with two-5mg  tablets twice daily for 7 days. On day  8, switch to one-5mg  tablet twice daily.   benzonatate 100 MG capsule Commonly known as: TESSALON Take 1 capsule (100 mg total) by mouth every 8 (eight) hours.   buPROPion 150 MG 24 hr tablet Commonly known as: WELLBUTRIN XL Take 150 mg by mouth daily.   cetirizine 10 MG tablet Commonly known as: ZYRTEC Take 10 mg by mouth daily. Takes in am   doxycycline 100 MG  EC tablet Commonly known as: DORYX Take 100 mg by mouth 2 (two) times daily.   esomeprazole 40 MG capsule Commonly known as: NEXIUM Take 40 mg by mouth 2 (two) times daily.   famotidine 40 MG tablet Commonly known as: PEPCID Take 40 mg by mouth 2 (two) times daily.   fluticasone 50 MCG/ACT nasal spray Commonly known as: FLONASE Place 1 spray into both nostrils daily.   gabapentin 300 MG capsule Commonly known as: NEURONTIN Take 300 mg by mouth 2 (two) times daily.   modafinil 200 MG tablet Commonly known as: PROVIGIL Take 200 mg by mouth daily. Takes in am   montelukast 10 MG tablet Commonly known as: SINGULAIR Take 10 mg by mouth at bedtime.   ondansetron 4 MG tablet Commonly known as: Zofran Take 1 tablet (4 mg total) by mouth every 8 (eight) hours as needed for nausea or vomiting.   traMADol 50 MG tablet Commonly known as: Ultram Take 1 tablet (50 mg total) by mouth every 6 (six) hours as needed for up to 5 days for moderate pain or severe pain.   VITAMIN D PO Take 10,000 Units by mouth 2 (two) times a week.        Allergies  Allergen Reactions   Sulfa Antibiotics Hives    GI upset    Consultations: None   Procedures/Studies: DG Chest 2 View  Result Date: 03/14/2022 CLINICAL DATA:  Chest pain, shortness of breath, cough EXAM: CHEST - 2 VIEW COMPARISON:  03/12/2022 FINDINGS: Cardiac size is within normal limits. Lung fields are clear of any infiltrates or pulmonary edema. Small linear densities in the left lower lung fields may suggest minimal scarring or minimal subsegmental atelectasis. There is no pleural effusion or pneumothorax. Deformity in the lateral end of right clavicle may be residual from previous injury. IMPRESSION: Small linear densities in the left lower lung fields suggest scarring or subsegmental atelectasis. There are no signs of pulmonary edema or focal pulmonary consolidation. Electronically Signed   By: Ernie Avena M.D.   On:  03/14/2022 13:08   CT Angio Chest PE W and/or Wo Contrast  Result Date: 03/14/2022 CLINICAL DATA:  Elevated D-dimer, chest pain and shortness of breath. COVID positive 10 days ago. EXAM: CT ANGIOGRAPHY CHEST WITH CONTRAST TECHNIQUE: Multidetector CT imaging of the chest was performed using the standard protocol during bolus administration of intravenous contrast. Multiplanar CT image reconstructions and MIPs were obtained to evaluate the vascular anatomy. RADIATION DOSE REDUCTION: This exam was performed according to the departmental dose-optimization program which includes automated exposure control, adjustment of the mA and/or kV according to patient size and/or use of iterative reconstruction technique. CONTRAST:  27mL OMNIPAQUE IOHEXOL 350 MG/ML SOLN COMPARISON:  CTA chest 10/12/2019 FINDINGS: Cardiovascular: Acute pulmonary embolus is present with lobar clot in the right upper lobe and right lower lobe pulmonary arteries and multiple segmental pulmonary emboli in the left upper lobe and left lower lobe pulmonary arteries. Right ventricular to left ventricular ratio 1.01. Mediastinum/Nodes: Unremarkable Lungs/Pleura: Stable small subpleural nodules along the minor fissure, no change from  12/12/2018, considered benign. Mild dependent atelectasis in both lower lobes. Mild subsegmental atelectasis posteriorly in the lingula. Upper Abdomen: Cholecystectomy. Musculoskeletal: Thoracic spondylosis. Review of the MIP images confirms the above findings. IMPRESSION: 1. Lobar clot in the right upper lobe and right lower lobe pulmonary arteries with multiple segmental pulmonary emboli in the left lung. Positive for acute PE with CT evidence of right heart strain (RV/LV Ratio = 1.01) consistent with at least submassive (intermediate risk) PE. The presence of right heart strain has been associated with an increased risk of morbidity and mortality. Please refer to the "Code PE Focused" order set in EPIC. 2. Mild dependent  atelectasis in both lower lobes and in the lingula. Critical Value/emergent results were called by telephone at the time of interpretation on 03/14/2022 at 5:05 pm to provider [facility] , who verbally acknowledged these results. Electronically Signed   By: Gaylyn Rong M.D.   On: 03/14/2022 17:05   DG Chest Portable 1 View  Result Date: 03/12/2022 CLINICAL DATA:  Positive COVID test. Respiratory symptoms for 1 week. EXAM: PORTABLE CHEST 1 VIEW COMPARISON:  One-view chest x-ray 10/12/2019 FINDINGS: Heart size is normal. Chronic interstitial coarsening is present. Minimal bibasilar airspace opacities are present. Lung volumes are low. Axial skeleton demonstrates mild degenerative change. Resection of the distal right clavicle again noted. IMPRESSION: Minimal bibasilar airspace disease likely reflects atelectasis. Infection is considered less likely. Electronically Signed   By: Marin Roberts M.D.   On: 03/12/2022 12:18   ECHOCARDIOGRAM COMPLETE  Result Date: 03/15/2022    ECHOCARDIOGRAM REPORT   Patient Name:   [facility] Date of Exam: 03/15/2022 Medical Rec #:  [id number]         Height:       68.0 in Accession #:    [id number]        Weight:       190.0 lb Date of Birth:  [birth year]         BSA:          2.000 m Patient Age:    65 years          BP:           127/82 mmHg Patient Gender: F                 HR:           82 bpm. Exam Location:  Inpatient Procedure: 2D Echo, Cardiac Doppler and Color Doppler Indications:    Pulmonary embolus  History:        Patient has no prior history of Echocardiogram examinations.                 Risk Factors:Hypertension.  Sonographer:    Neomia Dear RDCS Referring Phys: [id number] Orlene Salmons IMPRESSIONS  1. Left ventricular ejection fraction, by estimation, is 60 to 65%. The left ventricle has normal function. The left ventricle has no regional wall motion abnormalities. There is mild left ventricular hypertrophy. Left ventricular diastolic parameters are  consistent with Grade I diastolic dysfunction (impaired relaxation).  2. Right ventricular systolic function is mildly reduced. The right ventricular size is mildly enlarged.  3. The mitral valve is normal in structure. No evidence of mitral valve regurgitation. No evidence of mitral stenosis.  4. The aortic valve was not well visualized. Aortic valve regurgitation is not visualized. No aortic stenosis is present.  5. The inferior vena cava is normal in size with greater than 50% respiratory variability, suggesting right atrial  pressure of 3 mmHg. FINDINGS  Left Ventricle: Left ventricular ejection fraction, by estimation, is 60 to 65%. The left ventricle has normal function. The left ventricle has no regional wall motion abnormalities. The left ventricular internal cavity size was normal in size. There is  mild left ventricular hypertrophy. Left ventricular diastolic parameters are consistent with Grade I diastolic dysfunction (impaired relaxation). Right Ventricle: The right ventricular size is mildly enlarged. No increase in right ventricular wall thickness. Right ventricular systolic function is mildly reduced. Left Atrium: Left atrial size was normal in size. Right Atrium: Right atrial size was normal in size. Pericardium: There is no evidence of pericardial effusion. Mitral Valve: The mitral valve is normal in structure. No evidence of mitral valve regurgitation. No evidence of mitral valve stenosis. MV peak gradient, 2.6 mmHg. The mean mitral valve gradient is 1.0 mmHg. Tricuspid Valve: The tricuspid valve is normal in structure. Tricuspid valve regurgitation is not demonstrated. No evidence of tricuspid stenosis. Aortic Valve: The aortic valve was not well visualized. Aortic valve regurgitation is not visualized. No aortic stenosis is present. Aortic valve mean gradient measures 2.0 mmHg. Aortic valve peak gradient measures 3.9 mmHg. Aortic valve area, by VTI measures 5.41 cm. Pulmonic Valve: The pulmonic  valve was normal in structure. Pulmonic valve regurgitation is not visualized. No evidence of pulmonic stenosis. Aorta: The aortic root is normal in size and structure. Venous: The inferior vena cava is normal in size with greater than 50% respiratory variability, suggesting right atrial pressure of 3 mmHg. IAS/Shunts: No atrial level shunt detected by color flow Doppler.  LEFT VENTRICLE PLAX 2D LVIDd:         4.80 cm     Diastology LVIDs:         3.10 cm     LV e' medial:    7.94 cm/s LV PW:         1.10 cm     LV E/e' medial:  7.4 LV IVS:        1.20 cm     LV e' lateral:   7.83 cm/s LVOT diam:     2.60 cm     LV E/e' lateral: 7.5 LV SV:         99 LV SV Index:   50 LVOT Area:     5.31 cm  LV Volumes (MOD) LV vol d, MOD A2C: 49.4 ml LV vol d, MOD A4C: 55.3 ml LV vol s, MOD A2C: 16.8 ml LV vol s, MOD A4C: 26.6 ml LV SV MOD A2C:     32.6 ml LV SV MOD A4C:     55.3 ml LV SV MOD BP:      31.6 ml RIGHT VENTRICLE RV Basal diam:  4.50 cm RV Mid diam:    2.90 cm RV S prime:     12.70 cm/s TAPSE (M-mode): 2.9 cm LEFT ATRIUM             Index LA diam:        3.70 cm 1.85 cm/m LA Vol (A2C):   39.5 ml 19.75 ml/m LA Vol (A4C):   42.9 ml 21.45 ml/m LA Biplane Vol: 41.1 ml 20.55 ml/m  AORTIC VALVE                    PULMONIC VALVE AV Area (Vmax):    5.46 cm     PV Vmax:       0.83 m/s AV Area (Vmean):   5.29 cm  PV Vmean:      55.100 cm/s AV Area (VTI):     5.41 cm     PV VTI:        0.159 m AV Vmax:           99.20 cm/s   PV Peak grad:  2.8 mmHg AV Vmean:          65.900 cm/s  PV Mean grad:  1.0 mmHg AV VTI:            0.184 m AV Peak Grad:      3.9 mmHg AV Mean Grad:      2.0 mmHg LVOT Vmax:         102.00 cm/s LVOT Vmean:        65.600 cm/s LVOT VTI:          0.187 m LVOT/AV VTI ratio: 1.02  AORTA Ao Root diam: 3.40 cm Ao Asc diam:  3.40 cm MITRAL VALVE MV Area (PHT): 3.99 cm    SHUNTS MV Area VTI:   6.45 cm    Systemic VTI:  0.19 m MV Peak grad:  2.6 mmHg    Systemic Diam: 2.60 cm MV Mean grad:  1.0 mmHg MV  Vmax:       0.81 m/s MV Vmean:      50.8 cm/s MV Decel Time: 190 msec MV E velocity: 59.10 cm/s MV A velocity: 79.30 cm/s MV E/A ratio:  0.75 Charlton Haws MD Electronically signed by Charlton Haws MD Signature Date/Time: 03/15/2022/12:40:42 PM    Final    (Echo, Carotid, EGD, Colonoscopy, ERCP)    Subjective: Patient seen and examined.  Overnight had some chest pain on coughing otherwise mostly stable. Walking around in the room, 96% on room air.  Dry cough present.  Comfortable to go home.  Leg has some pain.   Discharge Exam: Vitals:   03/15/22 1035 03/15/22 1308  BP:  134/81  Pulse:  88  Resp:  16  Temp:  97.6 F (36.4 C)  SpO2: 98% 98%   Vitals:   03/15/22 0621 03/15/22 1030 03/15/22 1035 03/15/22 1308  BP: 127/82   134/81  Pulse: 76   88  Resp: 14   16  Temp: 97.9 F (36.6 C)   97.6 F (36.4 C)  TempSrc:    Oral  SpO2: 96% 96% 98% 98%  Weight:      Height:        General: Pt is alert, awake, not in acute distress, on room air. Cardiovascular: RRR, S1/S2 +, no rubs, no gallops Respiratory: CTA bilaterally, no wheezing, no rhonchi, no added sounds. Abdominal: Soft, NT, ND, bowel sounds + Extremities: no edema, no cyanosis    The results of significant diagnostics from this hospitalization (including imaging, microbiology, ancillary and laboratory) are listed below for reference.     Microbiology: No results found for this or any previous visit (from the past 240 hour(s)).   Labs: BNP (last 3 results) Recent Labs    03/14/22 1125  BNP 42.2   Basic Metabolic Panel: Recent Labs  Lab 03/14/22 1125  NA 139  K 3.7  CL 109  CO2 23  GLUCOSE 100*  BUN 16  CREATININE 0.69  CALCIUM 9.1   Liver Function Tests: Recent Labs  Lab 03/14/22 1125  AST 21  ALT 19  ALKPHOS 78  BILITOT 0.6  PROT 6.4*  ALBUMIN 3.6   No results for input(s): LIPASE, AMYLASE in the last 168 hours. No results for input(s):  AMMONIA in the last 168 hours. CBC: Recent Labs  Lab  03/14/22 1125 03/15/22 0045  WBC 7.7 7.7  NEUTROABS 4.4  --   HGB 14.0 13.5  HCT 43.1 41.5  MCV 89.8 89.6  PLT 278 254   Cardiac Enzymes: No results for input(s): CKTOTAL, CKMB, CKMBINDEX, TROPONINI in the last 168 hours. BNP: Invalid input(s): POCBNP CBG: No results for input(s): GLUCAP in the last 168 hours. D-Dimer Recent Labs    03/14/22 1125  DDIMER 2.20*   Hgb A1c No results for input(s): HGBA1C in the last 72 hours. Lipid Profile No results for input(s): CHOL, HDL, LDLCALC, TRIG, CHOLHDL, LDLDIRECT in the last 72 hours. Thyroid function studies No results for input(s): TSH, T4TOTAL, T3FREE, THYROIDAB in the last 72 hours.  Invalid input(s): FREET3 Anemia work up No results for input(s): VITAMINB12, FOLATE, FERRITIN, TIBC, IRON, RETICCTPCT in the last 72 hours. Urinalysis No results found for: COLORURINE, APPEARANCEUR, LABSPEC, PHURINE, GLUCOSEU, HGBUR, BILIRUBINUR, KETONESUR, PROTEINUR, UROBILINOGEN, NITRITE, LEUKOCYTESUR Sepsis Labs Invalid input(s): PROCALCITONIN,  WBC,  LACTICIDVEN Microbiology No results found for this or any previous visit (from the past 240 hour(s)).   Time coordinating discharge: 35 minutes  SIGNED:   Dorcas Carrow, MD  [facility] 03/15/2022, 1:33 PM

## 2022-03-15 NOTE — TOC Benefit Eligibility Note (Signed)
Patient Product/process development scientist completed.    The patient is currently admitted and upon discharge could be taking Eliquis Starter Pack.  The current 30 day co-pay is, $38.00.   The patient is insured through [employer] [facility] is the only pharmacy that takes T[employer]are)    Roland Earl, CPhT Pharmacy Patient Advocate Specialist [facility] Patient Advocate Team Direct Number: [phone]  Fax: [phone]

## 2022-03-15 NOTE — Progress Notes (Signed)
ANTICOAGULATION CONSULT NOTE - follow up  Pharmacy Consult for Heparin Indication: pulmonary embolus  Allergies  Allergen Reactions   Sulfa Antibiotics Hives    GI upset    Patient Measurements: Height: 5\' 8"  (172.7 cm) Weight: 86.2 kg (190 lb) IBW/kg (Calculated) : 63.9 Heparin Dosing Weight: 81.8 kg  Vital Signs: Temp: 97.5 F (36.4 C) (05/17 2127) Temp Source: Oral (05/17 2127) BP: 131/80 (05/17 2127) Pulse Rate: 82 (05/17 2127)  Labs: Recent Labs    03/14/22 1125 03/14/22 1254 03/14/22 1718 03/15/22 0045  HGB 14.0  --   --  13.5  HCT 43.1  --   --  41.5  PLT 278  --   --  254  APTT  --   --  24  --   LABPROT  --   --  13.3  --   INR  --   --  1.0  --   HEPARINUNFRC  --   --   --  0.66  CREATININE 0.69  --   --   --   TROPONINIHS 3 <2  --   --      Estimated Creatinine Clearance: 79.5 mL/min (by C-G formula based on SCr of 0.69 mg/dL).   Medical History: Past Medical History:  Diagnosis Date   Anxiety    Arthritis    Asthma    Depression    Fibromyalgia syndrome    Gestational diabetes    Hypertension    Pregnancy induced hypertension    Sleep apnea    Vaginal Pap smear, abnormal     Medications:  Scheduled:   benzonatate  100 mg Oral Q8H   buPROPion  150 mg Oral Daily   doxycycline  100 mg Oral BID   fluticasone  1 spray Each Nare Daily   gabapentin  300 mg Oral BID   loratadine  10 mg Oral Daily   modafinil  200 mg Oral Daily   montelukast  10 mg Oral QHS   pantoprazole  40 mg Oral Daily   pneumococcal 20-valent conjugate vaccine  0.5 mL Intramuscular Tomorrow-1000   sodium chloride (PF)       Infusions:   heparin 1,300 Units/hr (03/14/22 1736)   PRN:   Assessment: 66 yo female with HTN, fibromyalgia, DM, hx prior PE and recent COVID-19 presents with chest pain.  CTa shows +PE with RHS.  Pharmacy consulted to dose IV heparin.  No anticoagulants noted PTA.  Baseline CBC WNL.  03/15/2022 HL 0.66 therapeutic on 1300 units/hr CBC  WNL No bleeding noted  Goal of Therapy:  Heparin level 0.3-0.7 units/ml Monitor platelets by anticoagulation protocol: Yes   Plan:  Continue heparin drip at 1300 units/hr Confirmatory heparin level in 6 hours Daily CBC  03/17/2022 RPh 03/15/2022, 1:19 AM

## 2022-03-15 NOTE — Progress Notes (Signed)
  Transition of Care [facility]) Screening Note   Patient Details  Name: Christina Harrington Date of Birth: June 26, 1956   Transition of Care [facility]) CM/SW Contact:    Erin Sons, LCSW Phone Number: 03/15/2022, 1:32 PM    Transition of Care Department [facility]) has reviewed patient and no TOC needs have been identified at this time. We will continue to monitor patient advancement through interdisciplinary progression rounds. If new patient transition needs arise, please place a TOC consult.

## 2022-03-15 NOTE — Discharge Instructions (Signed)
Information on my medicine - ELIQUIS (apixaban)   Why was Eliquis prescribed for you? Eliquis was prescribed to treat blood clots that may have been found in the veins of your legs (deep vein thrombosis) or in your lungs (pulmonary embolism) and to reduce the risk of them occurring again.  What do You need to know about Eliquis ? The starting dose is 10 mg (two 5 mg tablets) taken TWICE daily for the FIRST SEVEN (7) DAYS, then on (enter date)  03/22/22  the dose is reduced to ONE 5 mg tablet taken TWICE daily.  Eliquis may be taken with or without food.   Try to take the dose about the same time in the morning and in the evening. If you have difficulty swallowing the tablet whole please discuss with your pharmacist how to take the medication safely.  Take Eliquis exactly as prescribed and DO NOT stop taking Eliquis without talking to the doctor who prescribed the medication.  Stopping may increase your risk of developing a new blood clot.  Refill your prescription before you run out.  After discharge, you should have regular check-up appointments with your healthcare provider that is prescribing your Eliquis.    What do you do if you miss a dose? If a dose of ELIQUIS is not taken at the scheduled time, take it as soon as possible on the same day and twice-daily administration should be resumed. The dose should not be doubled to make up for a missed dose.  Important Safety Information A possible side effect of Eliquis is bleeding. You should call your healthcare provider right away if you experience any of the following: Bleeding from an injury or your nose that does not stop. Unusual colored urine (red or dark brown) or unusual colored stools (red or black). Unusual bruising for unknown reasons. A serious fall or if you hit your head (even if there is no bleeding).  Some medicines may interact with Eliquis and might increase your risk of bleeding or clotting while on Eliquis. To  help avoid this, consult your healthcare provider or pharmacist prior to using any new prescription or non-prescription medications, including herbals, vitamins, non-steroidal anti-inflammatory drugs (NSAIDs) and supplements.  This website has more information on Eliquis (apixaban): http://www.eliquis.com/eliquis/home

## 2022-03-15 NOTE — Progress Notes (Signed)
Discharged home per order, discharge instructions reviewed with pt, voiced understanding. Monitor removed. IV d/c. All personal belongings sent with pt.

## 2022-03-15 NOTE — Progress Notes (Signed)
ANTICOAGULATION CONSULT NOTE - follow up  Pharmacy Consult for Heparin Indication: pulmonary embolus  Allergies  Allergen Reactions   Sulfa Antibiotics Hives    GI upset    Patient Measurements: Height: 5\' 8"  (172.7 cm) Weight: 86.2 kg (190 lb) IBW/kg (Calculated) : 63.9 Heparin Dosing Weight: 81.8 kg  Vital Signs: Temp: 97.9 F (36.6 C) (05/18 0621) Temp Source: Oral (05/18 0240) BP: 127/82 (05/18 0621) Pulse Rate: 76 (05/18 0621)  Labs: Recent Labs    03/14/22 1125 03/14/22 1254 03/14/22 1718 03/15/22 0045 03/15/22 0709  HGB 14.0  --   --  13.5  --   HCT 43.1  --   --  41.5  --   PLT 278  --   --  254  --   APTT  --   --  24  --   --   LABPROT  --   --  13.3  --   --   INR  --   --  1.0  --   --   HEPARINUNFRC  --   --   --  0.66 0.62  CREATININE 0.69  --   --   --   --   TROPONINIHS 3 <2  --   --   --      Estimated Creatinine Clearance: 79.5 mL/min (by C-G formula based on SCr of 0.69 mg/dL).   Medical History: Past Medical History:  Diagnosis Date   Anxiety    Arthritis    Asthma    Depression    Fibromyalgia syndrome    Gestational diabetes    Hypertension    Pregnancy induced hypertension    Sleep apnea    Vaginal Pap smear, abnormal     Medications:  Scheduled:   benzonatate  100 mg Oral Q8H   buPROPion  150 mg Oral Daily   doxycycline  100 mg Oral BID   fluticasone  1 spray Each Nare Daily   gabapentin  300 mg Oral BID   loratadine  10 mg Oral Daily   modafinil  200 mg Oral Daily   montelukast  10 mg Oral QHS   pantoprazole  40 mg Oral Daily   pneumococcal 20-valent conjugate vaccine  0.5 mL Intramuscular Tomorrow-1000   Infusions:   heparin 1,300 Units/hr (03/14/22 1736)   PRN:   Assessment: 66 yo female with HTN, fibromyalgia, DM, hx prior PE and recent COVID-19 presents with chest pain.  CTa shows +PE with RHS.  Pharmacy consulted to dose IV heparin.  No anticoagulants noted PTA.  Baseline CBC WNL.  Today, 03/15/22 -  Heparin level is therapeutic at 0.62 on heparin 1300 units/hr - CBC remains stable - No bleeding noted per RN  Goal of Therapy:  Heparin level 0.3-0.7 units/ml Monitor platelets by anticoagulation protocol: Yes   Plan:  Continue heparin drip at 1300 units/hr Monitor daily heparin level and CBC F/u ability to transition to PO anticoagulation  03/17/22, PharmD 03/15/2022 8:20 AM

## 2022-03-15 NOTE — Progress Notes (Signed)
O2 sat 96% on room air while ambulating in room.

## 2022-04-04 ENCOUNTER — Other Ambulatory Visit (HOSPITAL_COMMUNITY): Payer: Self-pay

## 2022-04-04 ENCOUNTER — Telehealth (HOSPITAL_BASED_OUTPATIENT_CLINIC_OR_DEPARTMENT_OTHER): Payer: Self-pay

## 2022-04-04 NOTE — Telephone Encounter (Signed)
Transitions of Care Pharmacy   Call attempted for a pharmacy transitions of care follow-up. HIPAA appropriate voicemail was left with call back information provided.   Call attempt #1. Will follow-up in 2-3 days.   Jiles Crocker, PharmD Clinical Pharmacist Med [facility] 04/04/2022 11:56 AM

## 2022-04-06 ENCOUNTER — Telehealth (HOSPITAL_COMMUNITY): Payer: Self-pay

## 2022-04-06 ENCOUNTER — Other Ambulatory Visit (HOSPITAL_COMMUNITY): Payer: Self-pay

## 2022-04-06 NOTE — Telephone Encounter (Signed)
Pharmacy Transitions of Care Follow-up Telephone Call  Date of discharge: 03/15/22  Discharge Diagnosis: PE  How have you been since you were released from the hospital? Patient has not been doing well since leaving the hospital and trying to recover from COVID and PE, but she is improving a little bit each day. She has already had refills sent to the [place].   Medication changes made at discharge: START taking these medications  START taking these medications  Eliquis DVT/PE Starter Pack Generic drug: Apixaban Starter Pack (10mg  and 5mg ) Take as directed on package: start with two-5mg  tablets twice daily for 7 days. On day 8, switch to one-5mg  tablet twice daily.   CONTINUE taking these medications  CONTINUE taking these medications  ALBUTEROL SULFATE PO  benzonatate 100 MG capsule Commonly known as: TESSALON Take 1 capsule (100 mg total) by mouth every 8 (eight) hours.  buPROPion 150 MG 24 hr tablet Commonly known as: WELLBUTRIN XL  cetirizine 10 MG tablet Commonly known as: ZYRTEC  doxycycline 100 MG EC tablet Commonly known as: DORYX  esomeprazole 40 MG capsule Commonly known as: NEXIUM  famotidine 40 MG tablet Commonly known as: PEPCID  fluticasone 50 MCG/ACT nasal spray Commonly known as: FLONASE  gabapentin 300 MG capsule Commonly known as: NEURONTIN  modafinil 200 MG tablet Commonly known as: PROVIGIL  montelukast 10 MG tablet Commonly known as: SINGULAIR  ondansetron 4 MG tablet Commonly known as: Zofran Take 1 tablet (4 mg total) by mouth every 8 (eight) hours as needed for nausea or vomiting.  VITAMIN D PO    Medication changes verified by the patient? yes    Medication Accessibility:  Home Pharmacy: [facility] [phone]   Was the patient provided with refills on discharged medications? no   Have all prescriptions been transferred from [facility] to home pharmacy? N/a   Is the patient able to afford medications? insured Notable copays: 38 Eligible patient  assistance: no    Medication Review:  APIXABAN (ELIQUIS)  Apixaban 10 mg BID initiated on 03/15/22. Will switch to apixaban 5 mg BID after 7 days (DATE 03/22/22).  - Discussed importance of taking medication around the same time everyday  - Reviewed potential DDIs with patient  - Advised patient of medications to avoid (NSAIDs, ASA)  - Educated that Tylenol (acetaminophen) will be the preferred analgesic to prevent risk of bleeding  - Emphasized importance of monitoring for signs and symptoms of bleeding (abnormal bruising, prolonged bleeding, nose bleeds, bleeding from gums, discolored urine, black tarry stools)  - Advised patient to alert all providers of anticoagulation therapy prior to starting a new medication or having a procedure   Follow-up Appointments:  [facility] seen PCP [facility]  If their condition worsens, is the pt aware to call PCP or go to the Emergency Dept.? yes  Final Patient Assessment: Patient has not been doing well since leaving the hospital and trying to recover from COVID and PE, but she is improving a little bit each day. She has already had refills sent to the 03/17/22.   03/24/22, PharmD Clinical Pharmacist Med [facility] 04/06/2022 4:20 PM

## 2022-07-17 DIAGNOSIS — E041 Nontoxic single thyroid nodule: Secondary | ICD-10-CM | POA: Insufficient documentation

## 2022-10-01 DIAGNOSIS — S8262XA Displaced fracture of lateral malleolus of left fibula, initial encounter for closed fracture: Secondary | ICD-10-CM | POA: Insufficient documentation

## 2022-10-01 HISTORY — DX: Displaced fracture of lateral malleolus of left fibula, initial encounter for closed fracture: S82.62XA

## 2022-11-09 ENCOUNTER — Other Ambulatory Visit (HOSPITAL_BASED_OUTPATIENT_CLINIC_OR_DEPARTMENT_OTHER): Payer: Self-pay | Admitting: Internal Medicine

## 2022-11-09 DIAGNOSIS — Z1231 Encounter for screening mammogram for malignant neoplasm of breast: Secondary | ICD-10-CM

## 2022-11-14 ENCOUNTER — Ambulatory Visit (HOSPITAL_BASED_OUTPATIENT_CLINIC_OR_DEPARTMENT_OTHER)
Admission: RE | Admit: 2022-11-14 | Discharge: 2022-11-14 | Disposition: A | Discharge status: Home / Self Care | Payer: Medicare Other | Source: Ambulatory Visit | Attending: Internal Medicine | Admitting: Internal Medicine

## 2022-11-14 DIAGNOSIS — Z1231 Encounter for screening mammogram for malignant neoplasm of breast: Secondary | ICD-10-CM | POA: Diagnosis present

## 2023-03-19 IMAGING — CT CT ANGIO CHEST
2 of 6 series · 18 of 36 positions shown · IV contrast (agent unspecified)
Comparison: CTA chest 10/12/2019

CLINICAL DATA: Elevated D-dimer, chest pain and shortness of
breath. COVID positive 10 days ago.

EXAM:
CT ANGIOGRAPHY CHEST WITH CONTRAST
TECHNIQUE: Multidetector CT imaging of the chest was performed using the
standard protocol during bolus administration of intravenous
contrast. Multiplanar CT image reconstructions and MIPs were
obtained to evaluate the vascular anatomy.

[Series 5: thins · axial · 0.75mm/px · z∈[-318,-29]mm · 17 of 327 slices shown]
[im 19/327  lung]
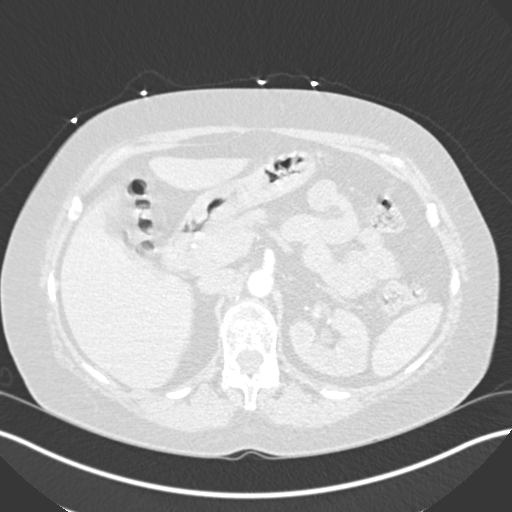
[im 37/327  mediastinal]
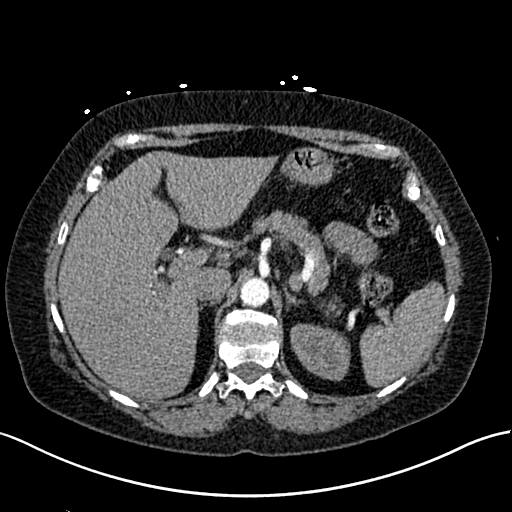
[im 55/327  lung]
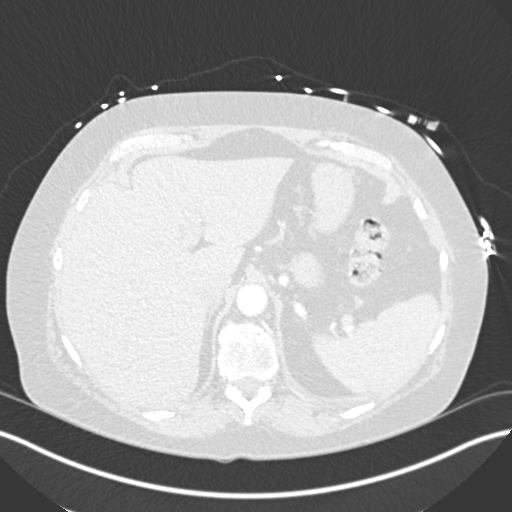
[im 73/327  mediastinal]
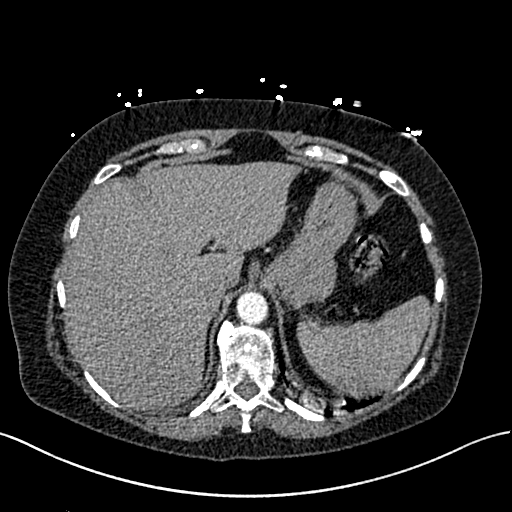
[im 91/327  lung]
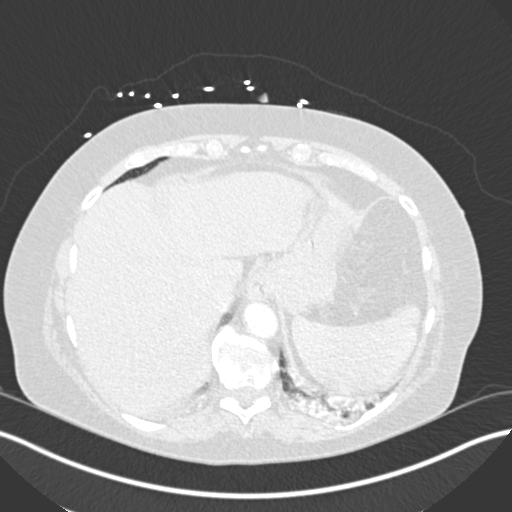
[im 109/327  mediastinal]
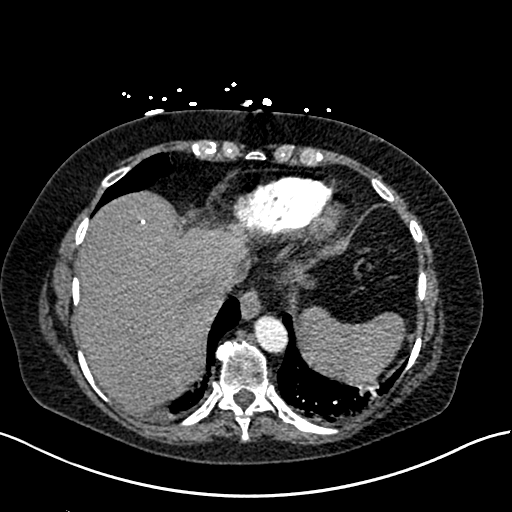
[im 127/327  lung]
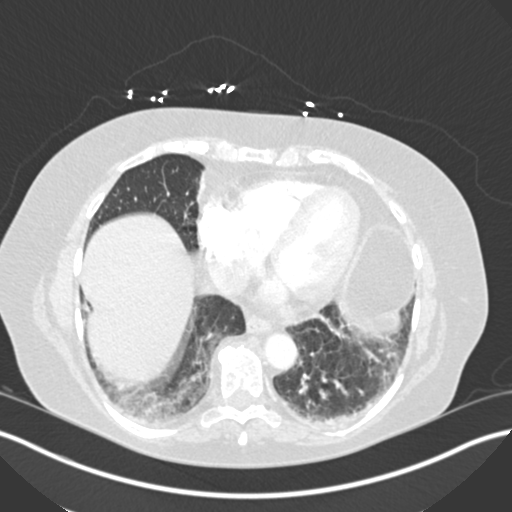
[im 145/327  mediastinal]
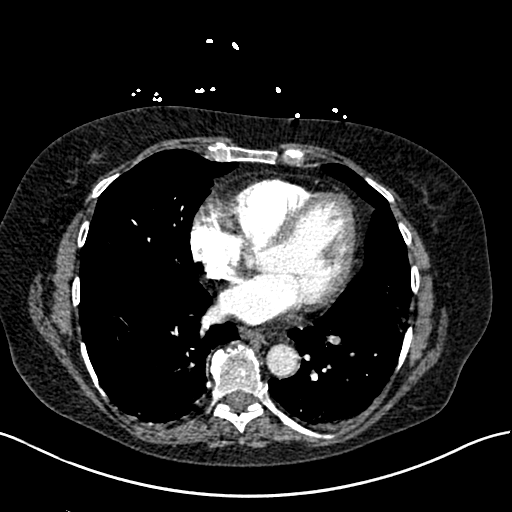
[im 164/327  lung]
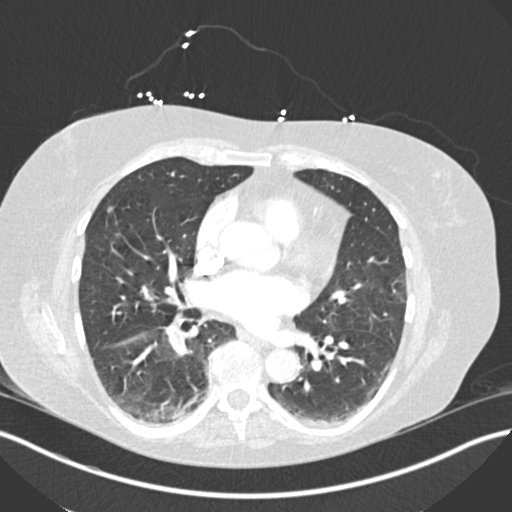
[im 182/327  mediastinal]
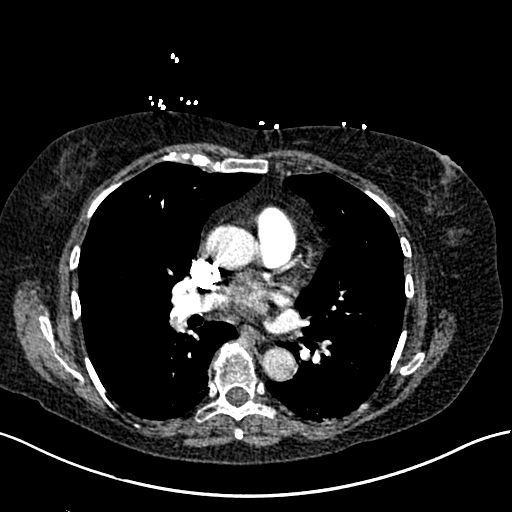
[im 200/327  lung]
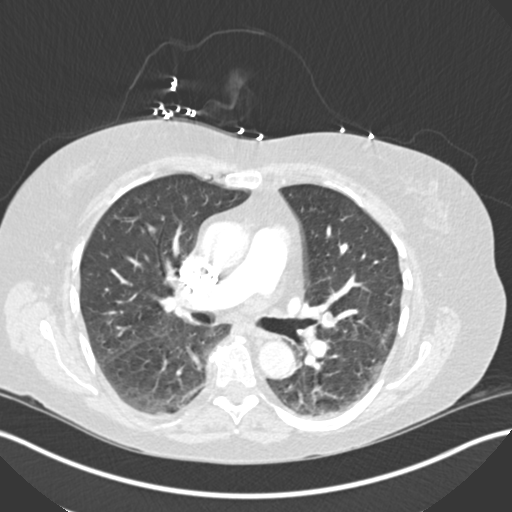
[im 218/327  mediastinal]
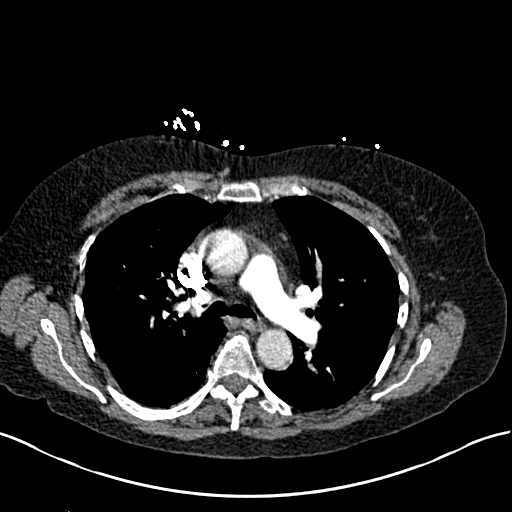
[im 236/327  lung]
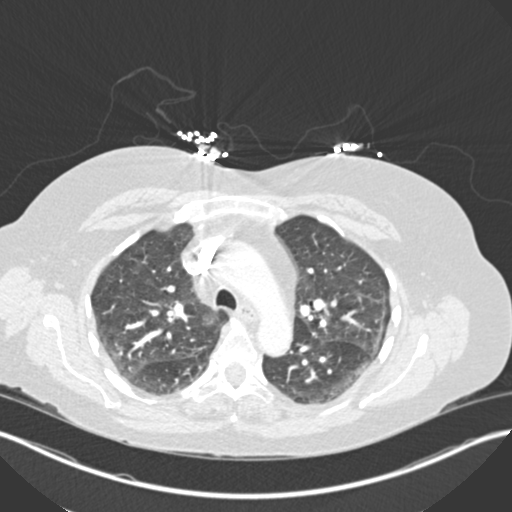
[im 254/327  mediastinal]
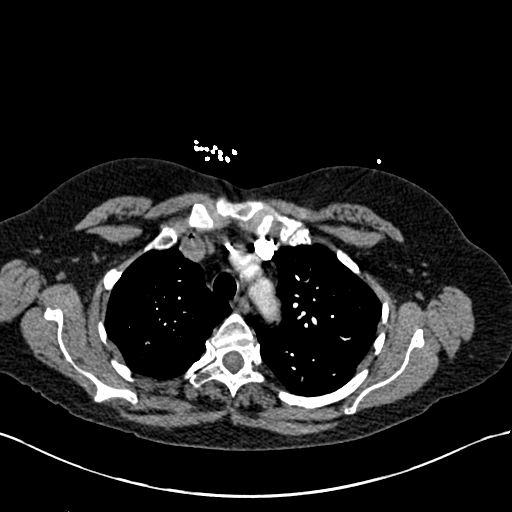
[im 272/327  lung]
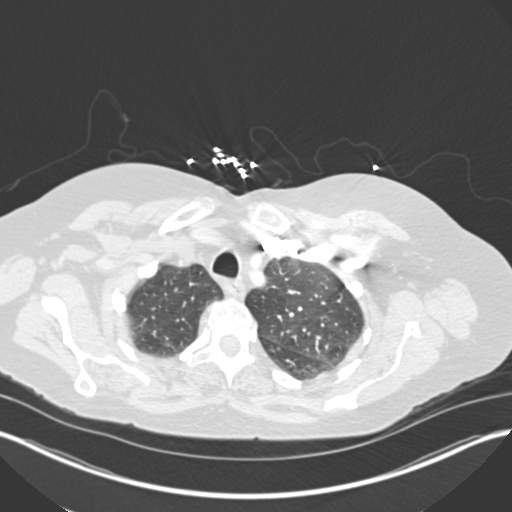
[im 290/327  mediastinal]
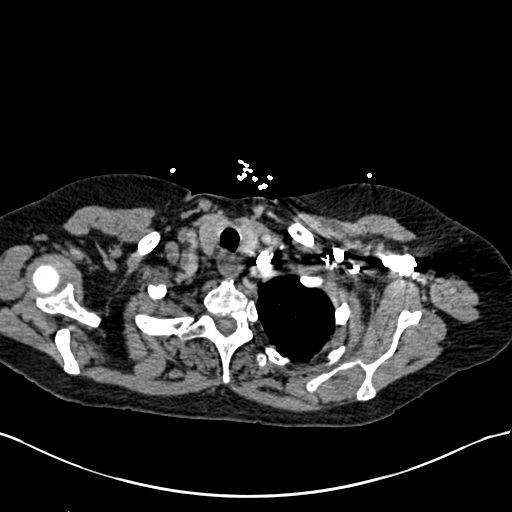
[im 308/327  lung]
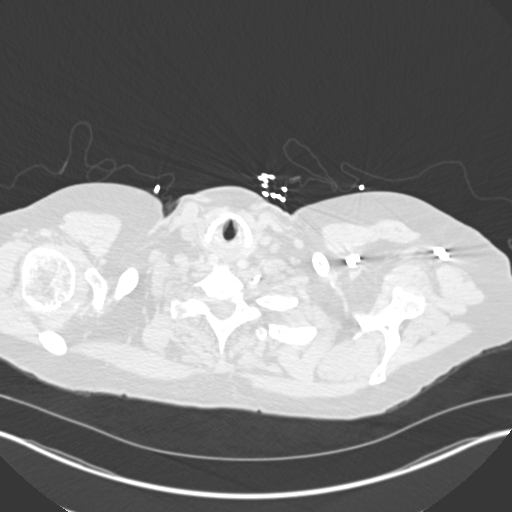

[Series 6: coronal mpr · coronal · 0.72mm/px · 1 of 139 slices shown]
[im 70/139  mediastinal]
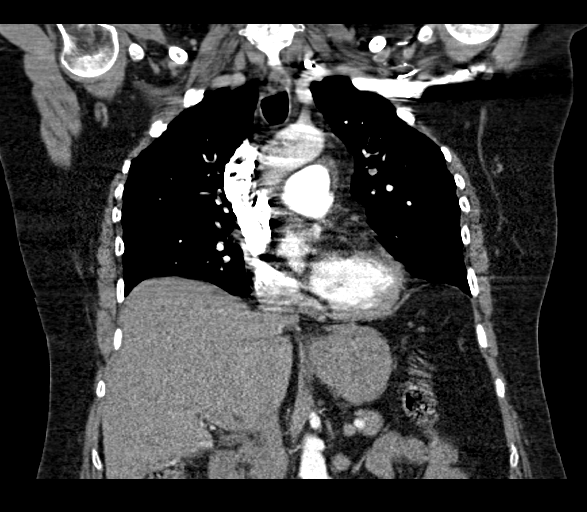

[18 of 36 positions shown; findings below may reference images not displayed]

RADIATION DOSE REDUCTION: This exam was performed according to the
departmental dose-optimization program which includes automated
exposure control, adjustment of the mA and/or kV according to
patient size and/or use of iterative reconstruction technique.

CONTRAST:  80mL OMNIPAQUE IOHEXOL 350 MG/ML SOLN
FINDINGS: Cardiovascular: Acute pulmonary embolus is present with lobar clot
in the right upper lobe and right lower lobe pulmonary arteries and
multiple segmental pulmonary emboli in the left upper lobe and left
lower lobe pulmonary arteries. Right ventricular to left ventricular
ratio 1.01.

Mediastinum/Nodes: Unremarkable

Lungs/Pleura: Stable small subpleural nodules along the minor
fissure, no change from 12/12/2018, considered benign.

Mild dependent atelectasis in both lower lobes. Mild subsegmental
atelectasis posteriorly in the lingula.

Upper Abdomen: Cholecystectomy.

Musculoskeletal: Thoracic spondylosis.

Review of the MIP images confirms the above findings.
IMPRESSION: 1. Lobar clot in the right upper lobe and right lower lobe pulmonary
arteries with multiple segmental pulmonary emboli in the left lung.
Positive for acute PE with CT evidence of right heart strain (RV/LV
Ratio = 1.01) consistent with at least submassive (intermediate
risk) PE. The presence of right heart strain has been associated
with an increased risk of morbidity and mortality. Please refer to
the "Code PE Focused" order set in [REDACTED].
2. Mild dependent atelectasis in both lower lobes and in the
lingula.

Critical Value/emergent results were called by telephone at the time
of interpretation on 03/14/2022 at [DATE] to provider NAZARETH JUMPER ,
who verbally acknowledged these results.

## 2023-06-05 DIAGNOSIS — M7540 Impingement syndrome of unspecified shoulder: Secondary | ICD-10-CM

## 2023-06-05 HISTORY — DX: Impingement syndrome of unspecified shoulder: M75.40

## 2023-12-10 ENCOUNTER — Other Ambulatory Visit (HOSPITAL_BASED_OUTPATIENT_CLINIC_OR_DEPARTMENT_OTHER): Payer: Self-pay | Admitting: Internal Medicine

## 2023-12-10 ENCOUNTER — Encounter (HOSPITAL_BASED_OUTPATIENT_CLINIC_OR_DEPARTMENT_OTHER): Payer: Self-pay | Admitting: Radiology

## 2023-12-10 ENCOUNTER — Ambulatory Visit (HOSPITAL_BASED_OUTPATIENT_CLINIC_OR_DEPARTMENT_OTHER)
Admission: RE | Admit: 2023-12-10 | Discharge: 2023-12-10 | Disposition: A | Discharge status: Home / Self Care | Payer: Medicare Other | Source: Ambulatory Visit | Attending: Internal Medicine | Admitting: Internal Medicine

## 2023-12-10 DIAGNOSIS — Z1231 Encounter for screening mammogram for malignant neoplasm of breast: Secondary | ICD-10-CM | POA: Insufficient documentation

## 2024-01-10 ENCOUNTER — Ambulatory Visit: Payer: Medicare Other | Admitting: Family Medicine

## 2024-01-10 ENCOUNTER — Encounter: Payer: Self-pay | Admitting: Family Medicine

## 2024-01-10 VITALS — BP 122/76 | HR 96 | Temp 97.6°F | Ht 68.0 in | Wt 201.0 lb

## 2024-01-10 DIAGNOSIS — K219 Gastro-esophageal reflux disease without esophagitis: Secondary | ICD-10-CM

## 2024-01-10 DIAGNOSIS — Z7689 Persons encountering health services in other specified circumstances: Secondary | ICD-10-CM

## 2024-01-10 DIAGNOSIS — Z86711 Personal history of pulmonary embolism: Secondary | ICD-10-CM

## 2024-01-10 DIAGNOSIS — Z860100 Personal history of colon polyps, unspecified: Secondary | ICD-10-CM | POA: Insufficient documentation

## 2024-01-10 DIAGNOSIS — J029 Acute pharyngitis, unspecified: Secondary | ICD-10-CM | POA: Insufficient documentation

## 2024-01-10 DIAGNOSIS — R490 Dysphonia: Secondary | ICD-10-CM | POA: Insufficient documentation

## 2024-01-10 DIAGNOSIS — Z9189 Other specified personal risk factors, not elsewhere classified: Secondary | ICD-10-CM | POA: Insufficient documentation

## 2024-01-10 DIAGNOSIS — M129 Arthropathy, unspecified: Secondary | ICD-10-CM | POA: Insufficient documentation

## 2024-01-10 DIAGNOSIS — H2510 Age-related nuclear cataract, unspecified eye: Secondary | ICD-10-CM | POA: Insufficient documentation

## 2024-01-10 DIAGNOSIS — K5730 Diverticulosis of large intestine without perforation or abscess without bleeding: Secondary | ICD-10-CM | POA: Insufficient documentation

## 2024-01-10 DIAGNOSIS — M5412 Radiculopathy, cervical region: Secondary | ICD-10-CM | POA: Insufficient documentation

## 2024-01-10 DIAGNOSIS — R7303 Prediabetes: Secondary | ICD-10-CM

## 2024-01-10 DIAGNOSIS — J4530 Mild persistent asthma, uncomplicated: Secondary | ICD-10-CM

## 2024-01-10 DIAGNOSIS — R609 Edema, unspecified: Secondary | ICD-10-CM | POA: Insufficient documentation

## 2024-01-10 DIAGNOSIS — G5600 Carpal tunnel syndrome, unspecified upper limb: Secondary | ICD-10-CM | POA: Insufficient documentation

## 2024-01-10 DIAGNOSIS — M064 Inflammatory polyarthropathy: Secondary | ICD-10-CM | POA: Insufficient documentation

## 2024-01-10 DIAGNOSIS — L719 Rosacea, unspecified: Secondary | ICD-10-CM

## 2024-01-10 DIAGNOSIS — G47429 Narcolepsy in conditions classified elsewhere without cataplexy: Secondary | ICD-10-CM

## 2024-01-10 DIAGNOSIS — G4733 Obstructive sleep apnea (adult) (pediatric): Secondary | ICD-10-CM

## 2024-01-10 DIAGNOSIS — M79644 Pain in right finger(s): Secondary | ICD-10-CM | POA: Insufficient documentation

## 2024-01-10 DIAGNOSIS — T17318D Gastric contents in larynx causing other injury, subsequent encounter: Secondary | ICD-10-CM | POA: Insufficient documentation

## 2024-01-10 DIAGNOSIS — G5620 Lesion of ulnar nerve, unspecified upper limb: Secondary | ICD-10-CM | POA: Insufficient documentation

## 2024-01-10 DIAGNOSIS — F332 Major depressive disorder, recurrent severe without psychotic features: Secondary | ICD-10-CM

## 2024-01-10 DIAGNOSIS — M25512 Pain in left shoulder: Secondary | ICD-10-CM

## 2024-01-10 DIAGNOSIS — G47419 Narcolepsy without cataplexy: Secondary | ICD-10-CM | POA: Insufficient documentation

## 2024-01-10 DIAGNOSIS — M47812 Spondylosis without myelopathy or radiculopathy, cervical region: Secondary | ICD-10-CM | POA: Insufficient documentation

## 2024-01-10 DIAGNOSIS — G894 Chronic pain syndrome: Secondary | ICD-10-CM | POA: Insufficient documentation

## 2024-01-10 DIAGNOSIS — M21619 Bunion of unspecified foot: Secondary | ICD-10-CM | POA: Insufficient documentation

## 2024-01-10 HISTORY — DX: Edema, unspecified: R60.9

## 2024-01-10 HISTORY — DX: Gastric contents in larynx causing other injury, subsequent encounter: T17.318D

## 2024-01-10 HISTORY — DX: Pain in left shoulder: M25.512

## 2024-01-10 HISTORY — DX: Acute pharyngitis, unspecified: J02.9

## 2024-01-10 HISTORY — DX: Diverticulosis of large intestine without perforation or abscess without bleeding: K57.30

## 2024-01-10 HISTORY — DX: Pain in right finger(s): M79.644

## 2024-01-10 NOTE — Patient Instructions (Signed)
 Thank you for trusting [place] with your healthcare.

## 2024-01-10 NOTE — Progress Notes (Signed)
 New Patient Office Visit  Subjective    Patient ID: Christina Harrington, female    DOB: [birth year]  Age: 68 y.o. MRN: [id number]  CC:  Chief Complaint  Patient presents with   Establish Care    Intermittent problems w left knee, not sure if from vacation from carrying large backpack  Retired PT    HPI Christina Harrington presents to establish care She goes to the [place] in [place].   Rheumatologist - at the [place] Dr. 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   apixaban (ELIQUIS) 5 MG TABS tablet Take by mouth.   benzonatate (TESSALON) 100 MG capsule Take 1 capsule (100 mg total) by mouth every 8 (eight) hours.   buPROPion (WELLBUTRIN XL) 150 MG 24 hr tablet Take 150 mg by mouth daily.   cetirizine (ZYRTEC) 10 MG tablet Take 10 mg by mouth daily. Takes in am   doxycycline (DORYX) 100 MG EC tablet Take 100 mg by mouth 2 (two) times daily.   estradiol (ESTRACE) 0.1 MG/GM vaginal cream Place vaginally.   fluticasone (FLONASE) 50 MCG/ACT nasal spray Place 1 spray into both nostrils daily.   gabapentin (NEURONTIN) 300 MG capsule Take 300 mg by mouth 2 (two) times daily.   methotrexate (RHEUMATREX) 2.5 MG tablet Take by mouth.   modafinil (PROVIGIL) 200 MG tablet Take 200 mg by  mouth daily. Takes in am   montelukast (SINGULAIR) 10 MG tablet Take 10 mg by mouth at bedtime.   RABEprazole (ACIPHEX) 20 MG tablet Take by mouth.   VITAMIN D PO Take 10,000 Units by mouth 2 (two) times a week.   [DISCONTINUED] fluticasone-salmeterol (ADVAIR) 500-50 MCG/ACT AEPB Take by mouth.   [DISCONTINUED] APIXABAN (ELIQUIS) VTE STARTER PACK (10MG  AND 5MG ) Take as directed on package: start with two-5mg  tablets twice daily for 7 days. On day 8, switch to one-5mg  tablet twice daily.   [DISCONTINUED] esomeprazole (NEXIUM) 40 MG capsule Take 40 mg by mouth 2 (two) times daily.   [DISCONTINUED] famotidine (PEPCID) 40 MG tablet Take 40 mg by mouth 2 (two) times daily.   [DISCONTINUED] ondansetron (ZOFRAN) 4 MG tablet Take 1 tablet (4 mg total) by mouth every 8 (eight) hours as needed for nausea or vomiting. (Patient not taking: Reported on 03/14/2022)   No facility-administered encounter medications on file as of 01/10/2024.    Past Medical History:  Diagnosis Date   Acute pharyngitis, unspecified 01/10/2024   Anxiety    Arthritis    Asthma    Closed fracture of lateral malleolus of left fibula 10/01/2022   Depression    Diverticulosis of colon 01/10/2024   Edema, unspecified 01/10/2024   Fibromyalgia syndrome  Gastric contents in larynx causing other injury, subsequent encounter 01/10/2024   Gestational diabetes    Hypertension    Metatarsalgia of right foot 04/26/2021   Pain in left shoulder 01/10/2024   Pain in right finger(s) 01/10/2024   Pregnancy induced hypertension    Sleep apnea    Subacromial impingement 06/05/2023   Vaginal Pap smear, abnormal     Past Surgical History:  Procedure Laterality Date   BREAST SURGERY     TUBAL LIGATION      Family History  Problem Relation Age of Onset   Hypertension Mother    Cancer Mother    Cancer Maternal Uncle    Breast cancer Paternal Aunt    Cancer Paternal Aunt    Hypertension Maternal Grandmother     Social History    Socioeconomic History   Marital status: Divorced    Spouse name: Not on file   Number of children: 3   Years of education: Not on file   Highest education level: Not on file  Occupational History   Not on file  Tobacco Use   Smoking status: Never   Smokeless tobacco: Never  Vaping Use   Vaping status: Never Used  Substance and Sexual Activity   Alcohol use: Never   Drug use: Never   Sexual activity: Not Currently  Other Topics Concern   Not on file  Social History Narrative   Not on file   Social Drivers of Health   Financial Resource Strain: Low Risk  (01/06/2023)   Received from [facility]   Overall Financial Resource Strain (CARDIA)    Difficulty of Paying Living Expenses: Not hard at all  Food Insecurity: No Food Insecurity (01/06/2023)   Received from [facility]   Hunger Vital Sign    Worried About Running Out of Food in the Last Year: Never true    Ran Out of Food in the Last Year: Never true  Transportation Needs: No Transportation Needs (01/06/2023)   Received from [facility] - Transportation    Lack of Transportation (Medical): No    Lack of Transportation (Non-Medical): No  Physical Activity: Insufficiently Active (01/06/2023)   Received from [facility]   Exercise Vital Sign    Days of Exercise per Week: 3 days    Minutes of Exercise per Session: 30 min  Stress: No Stress Concern Present (01/06/2023)   Received from [facility] - Occupational Stress Questionnaire    Feeling of Stress : Not at all  Social Connections: Socially Integrated (01/06/2023)   Received from [facility]   Social Network    How would you rate your social network (family, work, friends)?: Good participation with social networks  Intimate Partner Violence: Not At Risk (01/06/2023)   Received from [facility]   HITS    Over the last 12 months how often did your partner physically hurt you?: Never    Over the last 12  months how often did your partner insult you or talk down to you?: Never    Over the last 12 months how often did your partner threaten you with physical harm?: Never    Over the last 12 months how often did your partner scream or curse at you?: Never    Review of Systems  Constitutional:  Negative for chills and fever.  Respiratory:  Negative for shortness of breath.   Cardiovascular:  Negative for chest pain, palpitations and leg swelling.  Gastrointestinal:  Negative for abdominal pain, constipation, diarrhea, nausea and vomiting.  Genitourinary:  Negative for dysuria, frequency and urgency.  Musculoskeletal:  Positive for joint pain.  Neurological:  Negative for dizziness, focal weakness and headaches.  Psychiatric/Behavioral:  Positive for depression. Negative for substance abuse and suicidal ideas. The patient is not nervous/anxious.         Objective    BP 122/76 (BP Location: Left Arm, Patient Position: Sitting)   Pulse 96   Temp 97.6 F (36.4 C) (Temporal)   Ht 5\' 8"  (1.727 m)   Wt 201 lb (91.2 kg)   LMP  (LMP Unknown)   SpO2 98%   BMI 30.56 kg/m   Physical Exam Constitutional:      General: She is not in acute distress.    Appearance: She is not ill-appearing.  Eyes:     Extraocular Movements: Extraocular movements intact.     Conjunctiva/sclera: Conjunctivae normal.  Cardiovascular:     Rate and Rhythm: Normal rate and regular rhythm.  Pulmonary:     Effort: Pulmonary effort is normal.     Breath sounds: Normal breath sounds.  Musculoskeletal:     Cervical back: Normal range of motion and neck supple.  Skin:    General: Skin is warm and dry.  Neurological:     General: No focal deficit present.     Mental Status: She is alert and oriented to person, place, and time.  Psychiatric:        Mood and Affect: Mood normal.        Behavior: Behavior normal.        Thought Content: Thought content normal.         Assessment & Plan:   Problem List Items  Addressed This Visit     Asthma, chronic (Chronic)   Depression (Chronic)   Gastroesophageal reflux disease   Relevant Medications   RABEprazole (ACIPHEX) 20 MG tablet   History of pulmonary embolism   Narcolepsy   OSA on CPAP   Prediabetes - Primary   Rosacea   Other Visit Diagnoses       Encounter to establish care          She is a pleasant 68 year old female who is new to the practice and here to establish care.  Her care has predominantly been at the [place] and she will continue seeing them as well.  I will request records.  We reviewed her past medical history and medications today.  She is recovering from the flu. Continue current therapies and follow-up with specialist as recommended. Follow-up here as needed or in 6 months.   Return in about 6 months (around 07/12/2024).   Hetty Blend, NP-C

## 2024-03-02 LAB — HM DEXA SCAN: HM Dexa Scan: NORMAL

## 2024-04-15 ENCOUNTER — Ambulatory Visit (INDEPENDENT_AMBULATORY_CARE_PROVIDER_SITE_OTHER)

## 2024-04-15 VITALS — BP 122/62 | HR 84 | Ht 66.0 in | Wt 202.0 lb

## 2024-04-15 DIAGNOSIS — Z1159 Encounter for screening for other viral diseases: Secondary | ICD-10-CM | POA: Diagnosis not present

## 2024-04-15 DIAGNOSIS — Z0000 Encounter for general adult medical examination without abnormal findings: Secondary | ICD-10-CM | POA: Diagnosis not present

## 2024-04-15 NOTE — Progress Notes (Signed)
 Subjective:   Christina Harrington is a 68 y.o. who presents for a Medicare Wellness preventive visit.  As a reminder, Annual Wellness Visits don't include a physical exam, and some assessments may be limited, especially if this visit is performed virtually. We may recommend an in-person follow-up visit with your provider if needed.  Visit Complete: In person  Persons Participating in Visit: Patient.  AWV Questionnaire: No: Patient Medicare AWV questionnaire was not completed prior to this visit.  Cardiac Risk Factors include: advanced age (>72men, >56 women);obesity (BMI >30kg/m2)     Objective:    Today's Vitals   04/15/24 1521  BP: 122/62  Pulse: 84  SpO2: 96%  Weight: 202 lb (91.6 kg)  Height: 5' 6 (1.676 m)   Body mass index is 32.6 kg/m.     04/15/2024    3:19 PM 03/15/2022    2:17 PM 03/14/2022    6:50 PM 03/12/2022   11:53 AM 10/12/2019    2:15 PM  Advanced Directives  Does Patient Have a Medical Advance Directive? Yes No Yes No No  Type of Estate agent of [place];Living will  Living will;Healthcare Power of Attorney    Does patient want to make changes to medical advance directive?  No - Patient declined No - Patient declined    Copy of Healthcare Power of Attorney in Chart? No - copy requested  No - copy requested    Would patient like information on creating a medical advance directive?   No - Patient declined No - Patient declined No - Patient declined    Current Medications (verified) Outpatient Encounter Medications as of 04/15/2024  Medication Sig   ALBUTEROL  SULFATE PO Take 1 puff by mouth.   apixaban  (ELIQUIS ) 5 MG TABS tablet Take by mouth.   benzonatate  (TESSALON ) 100 MG capsule Take 1 capsule (100 mg total) by mouth every 8 (eight) hours.   buPROPion  (WELLBUTRIN  XL) 150 MG 24 hr tablet Take 150 mg by mouth daily.   cetirizine (ZYRTEC) 10 MG tablet Take 10 mg by mouth daily. Takes in am   doxycycline  (DORYX ) 100 MG EC tablet  Take 100 mg by mouth 2 (two) times daily.   estradiol (ESTRACE) 0.1 MG/GM vaginal cream Place vaginally.   fluticasone  (FLONASE ) 50 MCG/ACT nasal spray Place 1 spray into both nostrils daily.   gabapentin  (NEURONTIN ) 300 MG capsule Take 300 mg by mouth 2 (two) times daily.   methotrexate (RHEUMATREX) 2.5 MG tablet Take by mouth.   modafinil  (PROVIGIL ) 200 MG tablet Take 200 mg by mouth daily. Takes in am   montelukast  (SINGULAIR ) 10 MG tablet Take 10 mg by mouth at bedtime.   VITAMIN D PO Take 10,000 Units by mouth 2 (two) times a week.   RABEprazole (ACIPHEX) 20 MG tablet Take by mouth.   No facility-administered encounter medications on file as of 04/15/2024.    Allergies (verified) Clavulanic acid, Sulfa antibiotics, and Augmentin [amoxicillin-pot clavulanate]   History: Past Medical History:  Diagnosis Date   Acute pharyngitis, unspecified 01/10/2024   Anxiety    Arthritis    Asthma    Closed fracture of lateral malleolus of left fibula 10/01/2022   Depression    Diverticulosis of colon 01/10/2024   Edema, unspecified 01/10/2024   Fibromyalgia syndrome    Gastric contents in larynx causing other injury, subsequent encounter 01/10/2024   Gestational diabetes    Hypertension    Metatarsalgia of right foot 04/26/2021   Pain in left shoulder 01/10/2024  Pain in right finger(s) 01/10/2024   Pregnancy induced hypertension    Sleep apnea    Subacromial impingement 06/05/2023   Vaginal Pap smear, abnormal    Past Surgical History:  Procedure Laterality Date   BREAST SURGERY     TUBAL LIGATION     Family History  Problem Relation Age of Onset   Hypertension Mother    Cancer Mother    Cancer Maternal Uncle    Breast cancer Paternal Aunt    Cancer Paternal Aunt    Hypertension Maternal Grandmother    Social History   Socioeconomic History   Marital status: Divorced    Spouse name: Not on file   Number of children: 3   Years of education: Not on file   Highest  education level: Not on file  Occupational History   Not on file  Tobacco Use   Smoking status: Never   Smokeless tobacco: Never  Vaping Use   Vaping status: Never Used  Substance and Sexual Activity   Alcohol use: Never   Drug use: Never   Sexual activity: Not Currently  Other Topics Concern   Not on file  Social History Narrative   Not on file   Social Drivers of Health   Financial Resource Strain: Low Risk  (04/15/2024)   Overall Financial Resource Strain (CARDIA)    Difficulty of Paying Living Expenses: Not hard at all  Food Insecurity: No Food Insecurity (04/15/2024)   Hunger Vital Sign    Worried About Running Out of Food in the Last Year: Never true    Ran Out of Food in the Last Year: Never true  Transportation Needs: No Transportation Needs (04/15/2024)   PRAPARE - Administrator, Civil Service (Medical): No    Lack of Transportation (Non-Medical): No  Physical Activity: Insufficiently Active (04/15/2024)   Exercise Vital Sign    Days of Exercise per Week: 3 days    Minutes of Exercise per Session: 30 min  Stress: No Stress Concern Present (04/15/2024)   [employer] of Occupational Health - Occupational Stress Questionnaire    Feeling of Stress: Not at all  Social Connections: Socially Integrated (04/15/2024)   Social Connection and Isolation Panel    Frequency of Communication with Friends and Family: More than three times a week    Frequency of Social Gatherings with Friends and Family: More than three times a week    Attends Religious Services: More than 4 times per year    Active Member of [employer] or Organizations: Yes    Attends Engineer, structural: More than 4 times per year    Marital Status: Married    Tobacco Counseling Counseling given: Not Answered    Clinical Intake:  Pre-visit preparation completed: Yes  Pain : No/denies pain     BMI - recorded: 32.6 Nutritional Status: BMI > 30  Obese Nutritional Risks:  None Diabetes: No  No results found for: HGBA1C   How often do you need to have someone help you when you read instructions, pamphlets, or other written materials from your doctor or pharmacy?: 1 - Never  Interpreter Needed?: No  Information entered by :: Kandy Orris, CMA   Activities of Daily Living     04/15/2024    3:24 PM  In your present state of health, do you have any difficulty performing the following activities:  Hearing? 0  Vision? 0  Difficulty concentrating or making decisions? 0  Walking or climbing stairs? 1  Comment  left knee discomfort  Dressing or bathing? 0  Doing errands, shopping? 0  Preparing Food and eating ? N  Using the Toilet? N  In the past six months, have you accidently leaked urine? N  Do you have problems with loss of bowel control? N  Managing your Medications? N  Managing your Finances? N  Housekeeping or managing your Housekeeping? N    Patient Care Team: Abram Abraham, NP-C as PCP - General (Family Medicine)  I have updated your Care Teams any recent Medical Services you may have received from other providers in the past year.     Assessment:   This is a routine wellness examination for Christina Harrington.  Hearing/Vision screen Hearing Screening - Comments:: Denies hearing difficulties   Vision Screening - Comments:: Wears rx glasses - up to date with routine eye exams with Arl[facility]   Goals Addressed               This Visit's Progress     Patient Stated (pt-stated)        Patient stated she plans to continue exercising at the [facility]        Depression Screen     04/15/2024    3:28 PM 01/10/2024    1:17 PM 09/26/2021    1:59 PM  PHQ 2/9 Scores  PHQ - 2 Score 0 1 2  PHQ- 9 Score 2  10    Fall Risk     04/15/2024    3:26 PM 01/10/2024    1:17 PM  Fall Risk   Falls in the past year? 1 0  Number falls in past yr: 0 0  Comment 1   Injury with Fall? 0 0  Risk for fall due to :  No Fall Risks  Follow up Falls  evaluation completed;Falls prevention discussed Falls evaluation completed    MEDICARE RISK AT HOME:  Medicare Risk at Home Any stairs in or around the home?: Yes (ony with attic) If so, are there any without handrails?: No Home free of loose throw rugs in walkways, pet beds, electrical cords, etc?: Yes Adequate lighting in your home to reduce risk of falls?: Yes Life alert?: No Use of a cane, walker or w/c?: No Grab bars in the bathroom?: Yes Shower chair or bench in shower?: Yes Elevated toilet seat or a handicapped toilet?: Yes  TIMED UP AND GO:  Was the test performed?  No  Cognitive Function: 6CIT completed        04/15/2024    3:27 PM  6CIT Screen  What Year? 0 points  What month? 0 points  What time? 0 points  Count back from 20 0 points  Months in reverse 0 points  Repeat phrase 0 points  Total Score 0 points    Immunizations Immunization History  Administered Date(s) Administered   Fluad Quad(high Dose 65+) 09/02/2020, 08/20/2022   Fluad Trivalent(High Dose 65+) 07/26/2023   Fluzone Influenza virus vaccine,trivalent (IIV3), split virus 06/29/2014, 08/07/2016, 07/01/2018   H1N1 03/15/2009, 08/04/2009, 08/29/2010, 08/30/2011, 08/20/2012, 08/13/2013   Influenza Inj Mdck Quad Pf 08/12/2018   Influenza, Seasonal, Injecte, Preservative Fre 08/06/2015   Influenza,inj,Quad PF,6+ Mos 08/29/2017, 08/06/2019   Influenza-Unspecified 08/30/2003, 08/01/2004, 10/02/2005, 08/12/2006, 08/07/2007, 08/02/2008, 08/04/2009, 08/29/2010, 08/30/2011, 08/20/2012, 08/13/2013, 07/30/2015, 07/30/2019, 07/29/2021   Moderna Sars-Covid-2 Vaccination 02/03/2021   PFIZER(Purple Top)SARS-COV-2 Vaccination 12/12/2019, 01/08/2020, 08/12/2020   PNEUMOCOCCAL CONJUGATE-20 09/05/2023   Pfizer Covid-19 Vaccine Bivalent Booster 42yrs & up 07/31/2021   Pfizer(Comirnaty)Fall Seasonal Vaccine 12 years and  older 08/27/2022, 07/12/2023   Pneumococcal Conjugate-13 10/07/2014   Pneumococcal  Polysaccharide-23 10/02/2004, 10/02/2005, 07/11/2021   Respiratory Syncytial Virus Vaccine,Recomb Aduvanted(Arexvy) 09/18/2022   Td (Adult),5 Lf Tetanus Toxid, Preservative Free 07/11/2021   Td (Adult),unspecified 03/15/2009   Tdap 04/05/2011   Zoster Recombinant(Shingrix) 03/28/2018, 07/11/2018    Screening Tests Health Maintenance  Topic Date Due   Hepatitis C Screening  Never done   DEXA SCAN  Never done   COVID-19 Vaccine (8 - Pfizer risk 2024-25 season) 01/09/2024   INFLUENZA VACCINE  05/29/2024   Medicare Annual Wellness (AWV)  04/15/2025   MAMMOGRAM  12/09/2025   DTaP/Tdap/Td (3 - Td or Tdap) 07/12/2031   Colonoscopy  07/28/2031   Pneumococcal Vaccine: 50+ Years  Completed   Zoster Vaccines- Shingrix  Completed   HPV VACCINES  Aged Out   Meningococcal B Vaccine  Aged Out    Health Maintenance  Health Maintenance Due  Topic Date Due   Hepatitis C Screening  Never done   DEXA SCAN  Never done   COVID-19 Vaccine (8 - Pfizer risk 2024-25 season) 01/09/2024   Health Maintenance Items Addressed: Labs Ordered: Hepatitis C Screening   DEXA Scan status: Patient stated [facility] and will obtain report.  Additional Screening:  Vision Screening: Recommended annual ophthalmology exams for early detection of glaucoma and other disorders of the eye. Would you like a referral to an eye doctor? No  Pt had eye exam w/[facility] in 2025.   Dental Screening: Recommended annual dental exams for proper oral hygiene  Community Resource Referral / Chronic Care Management: CRR required this visit?  No   CCM required this visit?  No   Plan:    I have personally reviewed and noted the following in the patient's chart:   Medical and social history Use of alcohol, tobacco or illicit drugs  Current medications and supplements including opioid prescriptions. Patient is not currently taking opioid prescriptions. Functional ability and status Nutritional  status Physical activity Advanced directives List of other physicians Hospitalizations, surgeries, and ER visits in previous 12 months Vitals Screenings to include cognitive, depression, and falls Referrals and appointments  In addition, I have reviewed and discussed with patient certain preventive protocols, quality metrics, and best practice recommendations. A written personalized care plan for preventive services as well as general preventive health recommendations were provided to patient.   Patria Bookbinder, CMA   04/15/2024   After Visit Summary: (In Person-Declined) Patient declined AVS at this time.  Notes: Nothing significant to report at this time.

## 2024-04-15 NOTE — Patient Instructions (Signed)
 Ms. Hartung , Thank you for taking time out of your busy schedule to complete your Annual Wellness Visit with me. I enjoyed our conversation and look forward to speaking with you again next year. I, as well as your care team,  appreciate your ongoing commitment to your health goals. Please review the following plan we discussed and let me know if I can assist you in the future. Your Game plan/ To Do List    Referrals: If you haven't heard from the office you've been referred to, please reach out to them at the phone provided.  Ordered a Hepatitis C Screening (Lab).  Patient will obtain the DEXA Scan from the [facility]. Follow up Visits: Next Medicare AWV with our clinical staff: 04/20/2025   Have you seen your provider in the last 6 months (3 months if uncontrolled diabetes)? No Next Office Visit with your provider: 07/16/2024  Clinician Recommendations:  Aim for 30 minutes of exercise or brisk walking, 6-8 glasses of water, and 5 servings of fruits and vegetables each day.       This is a list of the screening recommended for you and due dates:  Health Maintenance  Topic Date Due   Hepatitis C Screening  Never done   DEXA scan (bone density measurement)  Never done   COVID-19 Vaccine (8 - Pfizer risk 2024-25 season) 01/09/2024   Flu Shot  05/29/2024   Medicare Annual Wellness Visit  04/15/2025   Mammogram  12/09/2025   DTaP/Tdap/Td vaccine (3 - Td or Tdap) 07/12/2031   Colon Cancer Screening  07/28/2031   Pneumococcal Vaccine for age over 32  Completed   Zoster (Shingles) Vaccine  Completed   HPV Vaccine  Aged Out   Meningitis B Vaccine  Aged Out    Advanced directives: (Copy Requested) Please bring a copy of your health care power of attorney and living will to the office to be added to your chart at your convenience. You can mail to [facility] [address] [place], [place] [address] or email to [email or web address] Advance Care Planning is important  because it:  [x]  Makes sure you receive the medical care that is consistent with your values, goals, and preferences  [x]  It provides guidance to your family and loved ones and reduces their decisional burden about whether or not they are making the right decisions based on your wishes.  Follow the link provided in your after visit summary or read over the paperwork we have mailed to you to help you started getting your Advance Directives in place. If you need assistance in completing these, please reach out to us  so that we can help you!

## 2024-07-16 ENCOUNTER — Ambulatory Visit (INDEPENDENT_AMBULATORY_CARE_PROVIDER_SITE_OTHER): Admitting: Family Medicine

## 2024-07-16 ENCOUNTER — Encounter: Payer: Self-pay | Admitting: Family Medicine

## 2024-07-16 VITALS — BP 102/66 | HR 97 | Temp 97.8°F | Ht 66.0 in | Wt 185.0 lb

## 2024-07-16 DIAGNOSIS — J4530 Mild persistent asthma, uncomplicated: Secondary | ICD-10-CM | POA: Diagnosis not present

## 2024-07-16 DIAGNOSIS — R7303 Prediabetes: Secondary | ICD-10-CM

## 2024-07-16 DIAGNOSIS — Z86711 Personal history of pulmonary embolism: Secondary | ICD-10-CM

## 2024-07-16 DIAGNOSIS — G4733 Obstructive sleep apnea (adult) (pediatric): Secondary | ICD-10-CM | POA: Diagnosis not present

## 2024-07-16 DIAGNOSIS — M064 Inflammatory polyarthropathy: Secondary | ICD-10-CM

## 2024-07-16 NOTE — Progress Notes (Signed)
 Subjective:     Patient ID: Christina Harrington, female    DOB: Dec 04, 1955, 68 y.o.   MRN: [id number]  Chief Complaint  Patient presents with   Medical Management of Chronic Issues    6 month f/u     HPI  Discussed the use of AI scribe software for clinical note transcription with the patient, who gave verbal consent to proceed.  History of Present Illness Christina Harrington is a 68 year old female with severe asthma who presents for a six month follow-up on chronic health conditions.  Asthma and respiratory symptoms - Severe asthma with frequent exacerbations and shortness of breath - Managed with long-acting inhaler, rescue inhaler, Singulair , and allergy medication  Thromboembolic disease - History of deep vein thrombosis and pulmonary embolism - Currently on Eliquis  for anticoagulation  Inflammatory arthritis - Treated with methotrexate - Liver function tests remain within normal limits  Glycemic control - Prediabetes under monitoring - Recent hemoglobin A1c of 6.3  Vaccination status and post-vaccination symptoms - Received recent COVID and influenza vaccines - Experienced fatigue following vaccination  Cardiac effects of medication - On modafinil , which increases heart rate to 80-90 bpm  Weight management - Intentional weight loss  Preventive health maintenance - Colonoscopy and mammogram are up to date - Pelvic exam scheduled  She goes to the [facility] in [place].    Rheumatologist - at [facility] Dr. 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   Past Medical History:  Diagnosis Date   Acute pharyngitis, unspecified 01/10/2024   Anxiety    Arthritis    Asthma    Closed fracture of lateral malleolus of left fibula 10/01/2022   Depression    Diverticulosis of colon 01/10/2024   Edema, unspecified 01/10/2024   Fibromyalgia syndrome    Gastric contents in larynx causing other injury, subsequent encounter 01/10/2024   Gestational diabetes    Hypertension    Metatarsalgia of right foot 04/26/2021   Pain in left shoulder 01/10/2024   Pain in right finger(s) 01/10/2024   Pregnancy induced hypertension    Sleep apnea    Subacromial impingement 06/05/2023   Vaginal Pap smear, abnormal     Past Surgical History:  Procedure Laterality Date   BREAST SURGERY     TUBAL LIGATION      Family History  Problem Relation Age of Onset   Hypertension Mother    Cancer Mother    Cancer Maternal Uncle    Breast cancer Paternal Aunt    Cancer Paternal Aunt    Hypertension Maternal Grandmother     Social History   Socioeconomic History   Marital status: Divorced  Spouse name: Not on file   Number of children: 3   Years of education: Not on file   Highest education level: Not on file  Occupational History   Not on file  Tobacco Use   Smoking status: Never   Smokeless tobacco: Never  Vaping Use   Vaping status: Never Used  Substance and Sexual Activity   Alcohol use: Never   Drug use: Never   Sexual activity: Not Currently  Other Topics Concern   Not on file  Social History Narrative   Not on file   Social Drivers of Health   Financial Resource Strain: Low Risk  (04/15/2024)   Overall Financial Resource Strain (CARDIA)    Difficulty of Paying  Living Expenses: Not hard at all  Food Insecurity: No Food Insecurity (04/15/2024)   Hunger Vital Sign    Worried About Running Out of Food in the Last Year: Never true    Ran Out of Food in the Last Year: Never true  Transportation Needs: No Transportation Needs (04/15/2024)   PRAPARE - Administrator, Civil Service (Medical): No    Lack of Transportation (Non-Medical): No  Physical Activity: Insufficiently Active (04/15/2024)   Exercise Vital Sign    Days of Exercise per Week: 3 days    Minutes of Exercise per Session: 30 min  Stress: No Stress Concern Present (04/15/2024)   [employer] of Occupational Health - Occupational Stress Questionnaire    Feeling of Stress: Not at all  Social Connections: Socially Integrated (04/15/2024)   Social Connection and Isolation Panel    Frequency of Communication with Friends and Family: More than three times a week    Frequency of Social Gatherings with Friends and Family: More than three times a week    Attends Religious Services: More than 4 times per year    Active Member of [employer] or Organizations: Yes    Attends Engineer, structural: More than 4 times per year    Marital Status: Married  Catering manager Violence: Not At Risk (04/15/2024)   Humiliation, Afraid, Rape, and Kick questionnaire    Fear of Current or Ex-Partner: No    Emotionally Abused: No    Physically Abused: No    Sexually Abused: No    Outpatient Medications Prior to Visit  Medication Sig Dispense Refill   ALBUTEROL  SULFATE PO Take 1 puff by mouth.     apixaban  (ELIQUIS ) 5 MG TABS tablet Take by mouth.     buPROPion  (WELLBUTRIN  XL) 150 MG 24 hr tablet Take 150 mg by mouth daily.     cetirizine (ZYRTEC) 10 MG tablet Take 10 mg by mouth daily. Takes in am     doxycycline  (DORYX ) 100 MG EC tablet Take 100 mg by mouth 2 (two) times daily.     estradiol (ESTRACE) 0.1 MG/GM vaginal cream Place vaginally.     fluticasone  (FLONASE ) 50 MCG/ACT nasal spray  Place 1 spray into both nostrils daily.     gabapentin  (NEURONTIN ) 300 MG capsule Take 300 mg by mouth 2 (two) times daily.     methotrexate (RHEUMATREX) 2.5 MG tablet Take by mouth.     modafinil  (PROVIGIL ) 200 MG tablet Take 200 mg by mouth daily. Takes in am     montelukast  (SINGULAIR ) 10 MG tablet Take 10 mg by mouth at bedtime.     RABEprazole (ACIPHEX) 20 MG tablet Take by mouth.     VITAMIN D PO Take 10,000 Units by mouth 2 (two) times  a week.     benzonatate  (TESSALON ) 100 MG capsule Take 1 capsule (100 mg total) by mouth every 8 (eight) hours. 21 capsule 0   No facility-administered medications prior to visit.    Allergies  Allergen Reactions   Clavulanic Acid    Sulfa Antibiotics Hives    GI upset   Augmentin [Amoxicillin-Pot Clavulanate] Diarrhea and Other (See Comments)    Review of Systems  Constitutional:  Positive for weight loss. Negative for chills, fever and malaise/fatigue.       Intentional   Respiratory:  Negative for shortness of breath.   Cardiovascular:  Negative for chest pain, palpitations and leg swelling.  Gastrointestinal:  Negative for abdominal pain, constipation, diarrhea, nausea and vomiting.  Genitourinary:  Negative for dysuria, frequency and urgency.  Musculoskeletal:  Positive for joint pain.  Neurological:  Negative for dizziness, focal weakness and headaches.  Endo/Heme/Allergies:  Does not bruise/bleed easily.  Psychiatric/Behavioral:  Negative for depression. The patient is not nervous/anxious.        Objective:    Physical Exam Constitutional:      General: She is not in acute distress.    Appearance: She is not ill-appearing.  HENT:     Mouth/Throat:     Mouth: Mucous membranes are moist.     Pharynx: Oropharynx is clear.  Eyes:     Extraocular Movements: Extraocular movements intact.     Conjunctiva/sclera: Conjunctivae normal.     Pupils: Pupils are equal, round, and reactive to light.  Cardiovascular:     Rate and Rhythm:  Normal rate and regular rhythm.  Pulmonary:     Effort: Pulmonary effort is normal.     Breath sounds: Normal breath sounds.  Musculoskeletal:     Cervical back: Normal range of motion and neck supple.     Right lower leg: No edema.     Left lower leg: No edema.  Skin:    General: Skin is warm and dry.  Neurological:     General: No focal deficit present.     Mental Status: She is alert and oriented to person, place, and time.  Psychiatric:        Mood and Affect: Mood normal.        Behavior: Behavior normal.        Thought Content: Thought content normal.      BP 102/66   Pulse 97   Temp 97.8 F (36.6 C) (Temporal)   Ht 5' 6 (1.676 m)   Wt 185 lb (83.9 kg)   LMP  (LMP Unknown)   SpO2 99%   BMI 29.86 kg/m  Wt Readings from Last 3 Encounters:  07/16/24 185 lb (83.9 kg)  04/15/24 202 lb (91.6 kg)  01/10/24 201 lb (91.2 kg)        Assessment & Plan:   Problem List Items Addressed This Visit     Asthma, chronic (Chronic)   History of pulmonary embolism   Inflammatory polyarthropathy (HCC)   OSA on CPAP   Prediabetes - Primary    Assessment and Plan Assessment & Plan Chronic health conditions follow-up Follow-up for multiple chronic conditions including OSA, GERD, severe asthma, rosacea, DVT and PE, inflammatory arthritis, fibromyalgia, prediabetes, and depression. Labs from the [facility] are unavailable, but she is knowledgeable about her health status. Last A1c was 6.3 five months ago, indicating prediabetes. Cholesterol is elevated due to high HDL. Liver and kidney functions are normal. She is current on colonoscopy and mammogram but overdue for a pelvic  exam. - Continue current management of chronic conditions. - U[facility] lab results to [facility]. - Schedule and complete pelvic exam.  Obstructive sleep apnea OSA managed with CPAP therapy. Continue CPAP   Gastroesophageal reflux disease GERD is a chronic condition. Continue current  treatment  Severe persistent asthma Asthma exacerbated by high ragweed levels. Managed with long-acting inhaler, rescue inhaler, Singulair , and allergy medication. Experiences some shortness of breath and uses inhalers regularly.  Rosacea Chronic condition, discussed as part of ongoing management of chronic conditions.  History of deep vein thrombosis and pulmonary embolism on long-term anticoagulation PE in 2017 and 2024. On Eliquis  indefinitely due to DVT and PE.   Inflammatory arthritis (rheumatoid factor and ANA negative) Inflammatory arthritis with high CRP and sed rate, managed with methotrexate. Presents like rheumatoid arthritis but is rheumatoid factor and ANA negative. Symptoms are managed but require ongoing monitoring.  Fibromyalgia Chronic condition, discussed as part of ongoing management of chronic conditions.  Prediabetes Last A1c was 6.3 five months ago, indicating prediabetes. Monitoring continues.  Depression, history of Mood is stable, no current depressive symptoms reported. Experiences fatigue post-COVID vaccination but mood remains unaffected.     I have discontinued Olam A. Schicker's benzonatate . I am also having her maintain her modafinil , cetirizine, buPROPion , doxycycline , montelukast , fluticasone , gabapentin , VITAMIN D PO, ALBUTEROL  SULFATE PO, RABEprazole, apixaban , methotrexate, and estradiol.  No orders of the defined types were placed in this encounter.

## 2024-11-28 ENCOUNTER — Emergency Department (HOSPITAL_COMMUNITY)
Admission: EM | Admit: 2024-11-28 | Discharge: 2024-11-28 | Disposition: A | Discharge status: Home / Self Care | Attending: Emergency Medicine | Admitting: Emergency Medicine

## 2024-11-28 ENCOUNTER — Encounter (HOSPITAL_COMMUNITY): Payer: Self-pay

## 2024-11-28 ENCOUNTER — Other Ambulatory Visit: Payer: Self-pay

## 2024-11-28 ENCOUNTER — Emergency Department (HOSPITAL_COMMUNITY)

## 2024-11-28 DIAGNOSIS — S0990XA Unspecified injury of head, initial encounter: Secondary | ICD-10-CM | POA: Insufficient documentation

## 2024-11-28 DIAGNOSIS — W009XXA Unspecified fall due to ice and snow, initial encounter: Secondary | ICD-10-CM | POA: Insufficient documentation

## 2024-11-28 DIAGNOSIS — Z7901 Long term (current) use of anticoagulants: Secondary | ICD-10-CM | POA: Diagnosis not present

## 2024-11-28 MED ORDER — ACETAMINOPHEN 500 MG PO TABS
1000.0000 mg | ORAL_TABLET | Freq: Once | ORAL | Status: AC
  Administered 2024-11-28: 1000 mg via ORAL
  Filled 2024-11-28: qty 2

## 2024-11-28 NOTE — ED Triage Notes (Signed)
 Pt BIB [facility] s/p fall. Pt is A&O x4. Resp even and unlabored. Pt denies LOC. Hit back of head. On eliquis  for PE prophylaxis.

## 2024-11-28 NOTE — ED Provider Notes (Signed)
 " [place] [facility] Provider Note   CSN: [id number] Arrival date & time: 11/28/24  1221     Patient presents with: Christina Harrington Christina Harrington is a 69 y.o. female.   69 yo F with a chief complaints of fall.  Patient was trying to take care of her house with the snow coming down.  Was trying to clear the walkway around the cars and she ended up inadvertently stepping on an area that had ice and slipped and struck the back of her head.  She is complaining of a headache.  She denies neck pain chest pain abdominal pain extremity pain.  She has had some stiffness to her left knee after she had surgery on it.  Denies any acute pain to that area.        Prior to Admission medications  Medication Sig Start Date End Date Taking? Authorizing Provider  ALBUTEROL  SULFATE PO Take 1 puff by mouth.    [provider]  apixaban  (ELIQUIS ) 5 MG TABS tablet Take by mouth. 08/27/23   [provider]  buPROPion  (WELLBUTRIN  XL) 150 MG 24 hr tablet Take 150 mg by mouth daily.    [provider]  cetirizine (ZYRTEC) 10 MG tablet Take 10 mg by mouth daily. Takes in am    [provider]  doxycycline  (DORYX ) 100 MG EC tablet Take 100 mg by mouth 2 (two) times daily.    [provider]  estradiol (ESTRACE) 0.1 MG/GM vaginal cream Place vaginally. 08/27/23   [provider]  fluticasone  (FLONASE ) 50 MCG/ACT nasal spray Place 1 spray into both nostrils daily.    [provider]  gabapentin  (NEURONTIN ) 300 MG capsule Take 300 mg by mouth 2 (two) times daily.    [provider]  methotrexate (RHEUMATREX) 2.5 MG tablet Take by mouth. 10/16/23   [provider]  modafinil  (PROVIGIL ) 200 MG tablet Take 200 mg by mouth daily. Takes in am    [provider]  montelukast  (SINGULAIR ) 10 MG tablet Take 10 mg by mouth at bedtime.    [provider]  RABEprazole (ACIPHEX) 20 MG tablet Take by  mouth. 11/14/23   [provider]  VITAMIN D PO Take 10,000 Units by mouth 2 (two) times a week.    [provider]    Allergies: Clavulanic acid, Sulfa antibiotics, and Augmentin [amoxicillin-pot clavulanate]    Review of Systems  Updated Vital Signs BP 119/64   Pulse 78   Temp 97.9 F (36.6 C) (Oral)   Resp 16   Ht 5' 8 (1.727 m)   Wt 79.4 kg   LMP  (LMP Unknown)   SpO2 98%   BMI 26.61 kg/m   Physical Exam Vitals and nursing note reviewed.  Constitutional:      General: She is not in acute distress.    Appearance: She is well-developed. She is not diaphoretic.  HENT:     Head: Normocephalic and atraumatic.  Eyes:     Pupils: Pupils are equal, round, and reactive to light.  Cardiovascular:     Rate and Rhythm: Normal rate and regular rhythm.     Heart sounds: No murmur heard.    No friction rub. No gallop.  Pulmonary:     Effort: Pulmonary effort is normal.     Breath sounds: No wheezing or rales.  Abdominal:     General: There is no distension.     Palpations: Abdomen is soft.  Tenderness: There is no abdominal tenderness.  Musculoskeletal:        General: No tenderness.     Cervical back: Normal range of motion and neck supple.     Comments: Palpated from head to toe without any obvious noted areas of bony tenderness  Skin:    General: Skin is warm and dry.  Neurological:     Mental Status: She is alert and oriented to person, place, and time.  Psychiatric:        Behavior: Behavior normal.     (all labs ordered are listed, but only abnormal results are displayed) Labs Reviewed - No data to display  EKG: None  Radiology: CT Head Wo Contrast Result Date: 11/28/2024 EXAM: CT HEAD WITHOUT CONTRAST 11/28/2024 12:47:44 PM TECHNIQUE: CT of the head was performed without the administration of intravenous contrast. Automated exposure control, iterative reconstruction, and/or weight based adjustment of the mA/kV was utilized to reduce the  radiation dose to as low as reasonably achievable. COMPARISON: None available. CLINICAL HISTORY: Head trauma, minor (age >= 65 years). FINDINGS: BRAIN AND VENTRICLES: No acute hemorrhage. No evidence of acute infarct. No hydrocephalus. No extra-axial collection. No mass effect or midline shift. Moderate periventricular and deep cerebral white matter disease. ORBITS: No acute abnormality. SINUSES: No acute abnormality. SOFT TISSUES AND SKULL: No acute soft tissue abnormality. No skull fracture. IMPRESSION: 1. No acute intracranial abnormality. Electronically signed by: Morgane Naveau MD 11/28/2024 01:03 PM EST RP Workstation: [id number]     Procedures   Medications Ordered in the ED  acetaminophen  (TYLENOL ) tablet 1,000 mg (1,000 mg Oral Given 11/28/24 1239)                                    Medical Decision Making Amount and/or Complexity of Data Reviewed Radiology: ordered.  Risk OTC drugs.   69 yo F with a chief complaint of a fall.  Patient was clearing some snow outside and she stepped on some ice and slipped and struck the back of her head.  She is on Eliquis  for PE in the past.  Will obtain CT imaging of the head.  She arrived as a level 2 trauma.  CT head without focal infiltrate or ptx.   3:18 PM:  I have discussed the diagnosis/risks/treatment options with the patient.  Evaluation and diagnostic testing in the emergency department does not suggest an emergent condition requiring admission or immediate intervention beyond what has been performed at this time.  They will follow up with PCP. We also discussed returning to the ED immediately if new or worsening sx occur. We discussed the sx which are most concerning (e.g., sudden worsening pain, fever, inability to tolerate by mouth) that necessitate immediate return. Medications administered to the patient during their visit and any new prescriptions provided to the patient are listed below.  Medications given during this  visit Medications  acetaminophen  (TYLENOL ) tablet 1,000 mg (1,000 mg Oral Given 11/28/24 1239)     The patient appears reasonably screen and/or stabilized for discharge and I doubt any other medical condition or other [facility] requiring further screening, evaluation, or treatment in the ED at this time prior to discharge.       Final diagnoses:  Injury of head, initial encounter  Fall on ice    ED Discharge Orders     None          Emil Share, DO 11/28/24 1518  "

## 2024-11-28 NOTE — Discharge Instructions (Signed)
Please return for sudden worsening headache confusion or vomiting.

## 2025-04-20 ENCOUNTER — Ambulatory Visit

## 2025-07-21 ENCOUNTER — Ambulatory Visit: Admitting: Family Medicine
